# Patient Record
Sex: Female | Born: 1950 | Race: Asian | Hispanic: No | Smoking: Never smoker
Health system: Southern US, Community
[De-identification: ages and names within clinical notes are randomized; demographics above are authoritative.]

## PROBLEM LIST (undated history)

## (undated) DIAGNOSIS — E119 Type 2 diabetes mellitus without complications: Secondary | ICD-10-CM

## (undated) DIAGNOSIS — E785 Hyperlipidemia, unspecified: Secondary | ICD-10-CM

## (undated) DIAGNOSIS — E1100 Type 2 diabetes mellitus with hyperosmolarity without nonketotic hyperglycemic-hyperosmolar coma (NKHHC): Secondary | ICD-10-CM

---

## 1998-09-24 ENCOUNTER — Ambulatory Visit (HOSPITAL_COMMUNITY): Admission: RE | Admit: 1998-09-24 | Discharge: 1998-09-24 | Payer: Self-pay

## 1999-09-23 ENCOUNTER — Ambulatory Visit (HOSPITAL_COMMUNITY): Admission: RE | Admit: 1999-09-23 | Discharge: 1999-09-23 | Payer: Self-pay | Admitting: Family Medicine

## 1999-09-23 ENCOUNTER — Encounter: Payer: Self-pay | Admitting: Family Medicine

## 2000-12-28 ENCOUNTER — Encounter: Payer: Self-pay | Admitting: Emergency Medicine

## 2000-12-28 ENCOUNTER — Emergency Department (HOSPITAL_COMMUNITY): Admission: EM | Admit: 2000-12-28 | Discharge: 2000-12-28 | Payer: Self-pay | Admitting: Emergency Medicine

## 2015-07-23 ENCOUNTER — Other Ambulatory Visit: Payer: Self-pay

## 2015-07-23 DIAGNOSIS — Z1231 Encounter for screening mammogram for malignant neoplasm of breast: Secondary | ICD-10-CM

## 2015-08-05 ENCOUNTER — Ambulatory Visit
Admission: RE | Admit: 2015-08-05 | Discharge: 2015-08-05 | Disposition: A | Discharge status: Home / Self Care | Payer: BLUE CROSS/BLUE SHIELD | Source: Ambulatory Visit

## 2015-08-05 DIAGNOSIS — Z1231 Encounter for screening mammogram for malignant neoplasm of breast: Secondary | ICD-10-CM

## 2015-08-10 ENCOUNTER — Other Ambulatory Visit: Payer: Self-pay | Admitting: Family Medicine

## 2015-08-10 DIAGNOSIS — R928 Other abnormal and inconclusive findings on diagnostic imaging of breast: Secondary | ICD-10-CM

## 2015-11-08 ENCOUNTER — Ambulatory Visit
Admission: RE | Admit: 2015-11-08 | Discharge: 2015-11-08 | Disposition: A | Discharge status: Home / Self Care | Payer: BLUE CROSS/BLUE SHIELD | Source: Ambulatory Visit | Attending: Family Medicine | Admitting: Family Medicine

## 2015-11-08 ENCOUNTER — Other Ambulatory Visit: Payer: Self-pay | Admitting: Family Medicine

## 2015-11-08 DIAGNOSIS — R928 Other abnormal and inconclusive findings on diagnostic imaging of breast: Secondary | ICD-10-CM

## 2016-06-29 ENCOUNTER — Other Ambulatory Visit: Payer: Self-pay | Admitting: Family Medicine

## 2016-06-29 DIAGNOSIS — R921 Mammographic calcification found on diagnostic imaging of breast: Secondary | ICD-10-CM

## 2016-06-30 ENCOUNTER — Other Ambulatory Visit: Payer: Self-pay | Admitting: Family Medicine

## 2016-06-30 ENCOUNTER — Ambulatory Visit
Admission: RE | Admit: 2016-06-30 | Discharge: 2016-06-30 | Disposition: A | Discharge status: Home / Self Care | Payer: BLUE CROSS/BLUE SHIELD | Source: Ambulatory Visit | Attending: Family Medicine | Admitting: Family Medicine

## 2016-06-30 DIAGNOSIS — R059 Cough, unspecified: Secondary | ICD-10-CM

## 2016-06-30 DIAGNOSIS — R05 Cough: Secondary | ICD-10-CM

## 2016-07-28 ENCOUNTER — Ambulatory Visit
Admission: RE | Admit: 2016-07-28 | Discharge: 2016-07-28 | Disposition: A | Discharge status: Home / Self Care | Payer: BLUE CROSS/BLUE SHIELD | Source: Ambulatory Visit | Attending: Family Medicine | Admitting: Family Medicine

## 2016-07-28 ENCOUNTER — Encounter: Payer: Self-pay | Admitting: Radiology

## 2016-07-28 ENCOUNTER — Other Ambulatory Visit: Payer: Self-pay | Admitting: Family Medicine

## 2016-07-28 DIAGNOSIS — R921 Mammographic calcification found on diagnostic imaging of breast: Secondary | ICD-10-CM

## 2017-04-27 ENCOUNTER — Emergency Department (HOSPITAL_COMMUNITY): Payer: Worker's Compensation

## 2017-04-27 ENCOUNTER — Encounter (HOSPITAL_COMMUNITY): Payer: Self-pay | Admitting: *Deleted

## 2017-04-27 ENCOUNTER — Other Ambulatory Visit: Payer: Self-pay

## 2017-04-27 ENCOUNTER — Emergency Department (HOSPITAL_COMMUNITY)
Admission: EM | Admit: 2017-04-27 | Discharge: 2017-04-27 | Disposition: A | Discharge status: Home / Self Care | Payer: Worker's Compensation | Attending: Physician Assistant | Admitting: Physician Assistant

## 2017-04-27 DIAGNOSIS — Y9263 Factory as the place of occurrence of the external cause: Secondary | ICD-10-CM | POA: Insufficient documentation

## 2017-04-27 DIAGNOSIS — S6992XA Unspecified injury of left wrist, hand and finger(s), initial encounter: Secondary | ICD-10-CM | POA: Diagnosis present

## 2017-04-27 DIAGNOSIS — Y939 Activity, unspecified: Secondary | ICD-10-CM | POA: Insufficient documentation

## 2017-04-27 DIAGNOSIS — S68617A Complete traumatic transphalangeal amputation of left little finger, initial encounter: Secondary | ICD-10-CM | POA: Insufficient documentation

## 2017-04-27 DIAGNOSIS — Y990 Civilian activity done for income or pay: Secondary | ICD-10-CM | POA: Diagnosis not present

## 2017-04-27 DIAGNOSIS — S68119A Complete traumatic metacarpophalangeal amputation of unspecified finger, initial encounter: Secondary | ICD-10-CM

## 2017-04-27 DIAGNOSIS — Z23 Encounter for immunization: Secondary | ICD-10-CM | POA: Insufficient documentation

## 2017-04-27 DIAGNOSIS — W230XXA Caught, crushed, jammed, or pinched between moving objects, initial encounter: Secondary | ICD-10-CM | POA: Diagnosis not present

## 2017-04-27 LAB — CBC WITH DIFFERENTIAL/PLATELET
BASOS ABS: 0 10*3/uL (ref 0.0–0.1)
Basophils Relative: 0 %
EOS PCT: 6 %
Eosinophils Absolute: 0.4 10*3/uL (ref 0.0–0.7)
HCT: 41.6 % (ref 36.0–46.0)
Hemoglobin: 13.9 g/dL (ref 12.0–15.0)
LYMPHS ABS: 2.4 10*3/uL (ref 0.7–4.0)
Lymphocytes Relative: 35 %
MCH: 28.3 pg (ref 26.0–34.0)
MCHC: 33.4 g/dL (ref 30.0–36.0)
MCV: 84.7 fL (ref 78.0–100.0)
MONO ABS: 0.6 10*3/uL (ref 0.1–1.0)
Monocytes Relative: 9 %
Neutro Abs: 3.6 10*3/uL (ref 1.7–7.7)
Neutrophils Relative %: 50 %
PLATELETS: 205 10*3/uL (ref 150–400)
RBC: 4.91 MIL/uL (ref 3.87–5.11)
RDW: 12.8 % (ref 11.5–15.5)
WBC: 7 10*3/uL (ref 4.0–10.5)

## 2017-04-27 LAB — BASIC METABOLIC PANEL
Anion gap: 7 (ref 5–15)
BUN: 12 mg/dL (ref 6–20)
CO2: 26 mmol/L (ref 22–32)
CREATININE: 0.79 mg/dL (ref 0.44–1.00)
Calcium: 9.3 mg/dL (ref 8.9–10.3)
Chloride: 107 mmol/L (ref 101–111)
GFR calc Af Amer: >60 mL/min (ref >60)
Glucose, Bld: 124 mg/dL — ABNORMAL HIGH (ref 65–99)
Potassium: 4 mmol/L (ref 3.5–5.1)
SODIUM: 140 mmol/L (ref 135–145)

## 2017-04-27 LAB — PROTIME-INR
INR: 1.08
Prothrombin Time: 13.9 seconds (ref 11.4–15.2)

## 2017-04-27 MED ORDER — ONDANSETRON HCL 4 MG/2ML IJ SOLN
INTRAMUSCULAR | Status: AC
  Filled 2017-04-27: qty 2

## 2017-04-27 MED ORDER — BUPIVACAINE HCL (PF) 0.25 % IJ SOLN: 30.0000 mL | Freq: Once | INTRAMUSCULAR | Status: DC

## 2017-04-27 MED ORDER — CEPHALEXIN 500 MG PO CAPS: 500.0000 mg | ORAL_CAPSULE | Freq: Four times a day (QID) | ORAL | 0 refills | Status: AC

## 2017-04-27 MED ORDER — SODIUM CHLORIDE 0.9 % IV BOLUS (SEPSIS)
1000.0000 mL | Freq: Once | INTRAVENOUS | Status: AC
  Administered 2017-04-27: 1000 mL via INTRAVENOUS

## 2017-04-27 MED ORDER — CEFAZOLIN SODIUM-DEXTROSE 1-4 GM/50ML-% IV SOLN
1.0000 g | Freq: Once | INTRAVENOUS | Status: AC
  Administered 2017-04-27: 1 g via INTRAVENOUS
  Filled 2017-04-27: qty 50

## 2017-04-27 MED ORDER — BUPIVACAINE HCL 0.25 % IJ SOLN
5.0000 mL | Freq: Once | INTRAMUSCULAR | Status: DC
  Filled 2017-04-27: qty 5

## 2017-04-27 MED ORDER — HYDROCODONE-ACETAMINOPHEN 5-325 MG PO TABS: 1.0000 | ORAL_TABLET | ORAL | 0 refills | Status: DC | PRN

## 2017-04-27 MED ORDER — ONDANSETRON HCL 4 MG/2ML IJ SOLN
4.0000 mg | Freq: Once | INTRAMUSCULAR | Status: AC
  Administered 2017-04-27: 4 mg via INTRAVENOUS

## 2017-04-27 MED ORDER — TETANUS-DIPHTH-ACELL PERTUSSIS 5-2.5-18.5 LF-MCG/0.5 IM SUSP
0.5000 mL | Freq: Once | INTRAMUSCULAR | Status: AC
  Administered 2017-04-27: 0.5 mL via INTRAMUSCULAR
  Filled 2017-04-27: qty 0.5

## 2017-04-27 MED ORDER — BUPIVACAINE HCL (PF) 0.25 % IJ SOLN
5.0000 mL | Freq: Once | INTRAMUSCULAR | Status: AC
  Administered 2017-04-27: 5 mL
  Filled 2017-04-27: qty 30

## 2017-04-27 MED ORDER — MORPHINE SULFATE (PF) 4 MG/ML IV SOLN
4.0000 mg | Freq: Once | INTRAVENOUS | Status: AC
  Administered 2017-04-27: 4 mg via INTRAVENOUS
  Filled 2017-04-27: qty 1

## 2017-04-27 MED ORDER — METOCLOPRAMIDE HCL 5 MG/ML IJ SOLN
5.0000 mg | Freq: Once | INTRAMUSCULAR | Status: AC
  Administered 2017-04-27: 5 mg via INTRAVENOUS
  Filled 2017-04-27: qty 2

## 2017-04-27 NOTE — ED Provider Notes (Signed)
[facility] Provider Note   CSN: [id number] Arrival date & time: 04/27/17  1716     History   Chief Complaint Chief Complaint  Patient presents with  . Hand Injury    HPI Jorene L Marga HootsSiu is a 66 y.o. female.  HPI   Patient is a 66 year old female with no significant past medical history presenting for a left fifth digit distal finger amputation that occurred around 4:50 PM on 04-27-2017.  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  Her nail got caught in the glide rail apparatus as the cart got away from her and when she pulled back it removed the distal tip of the finger.  No clear crush injury occurred.  Patient has sensation to the distal tip of the remaining finger, however she has had difficulty with range of motion due to pain.  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  An unknown hemostatic powder was applied to the distal tip of the finger at the factory.  Tetanus is not up-to-date. Patient is not diabetic.  History obtained with [place] interpreter present.  History reviewed. No pertinent past medical history.  There are no active problems to display for this patient.   History reviewed. No pertinent surgical history.  OB History    No data available       Home Medications    Prior to Admission medications   Medication Sig Start Date End Date Taking? Authorizing Provider  cephALEXin (KEFLEX) 500 MG capsule Take 1 capsule (500 mg total) by mouth 4 (four) times daily for 7 days. 04/27/17 05/04/17  Aviva KluverMurray, Keante Urizar B, PA-C  HYDROcodone-acetaminophen (NORCO/VICODIN) 5-325 MG tablet Take 1 tablet by mouth every 4 (four) hours as needed. 04/27/17   Elisha PonderMurray, Denys Labree B, PA-C    Family History No family history on file.  Social History Social History   Tobacco Use  .  Smoking status: Never Smoker  . Smokeless tobacco: Never Used  Substance Use Topics  . Alcohol use: No    Frequency: Never  . Drug use: Not on file     Allergies   Patient has no known allergies.   Review of Systems Review of Systems  Respiratory: Negative for shortness of breath.   Cardiovascular: Negative for chest pain.  Gastrointestinal: Positive for nausea.  Skin: Positive for wound.  Neurological: Negative for syncope, weakness and numbness.  All other systems reviewed and are negative.    Physical Exam Updated Vital Signs BP (!) 159/68 (BP Location: Right Arm)   Pulse 63   Temp 98.8 F (37.1 C) (Oral)   Resp 20   Ht 5\' 2"  (1.575 m)   Wt 68 kg (150 lb)   SpO2 99%   BMI 27.44 kg/m   Physical Exam  Constitutional: She appears well-developed and well-nourished. No distress.  HENT:  Head: Normocephalic and atraumatic.  Eyes: Conjunctivae and EOM are normal.  Neck: Normal range of motion. Neck supple.  Cardiovascular: Normal rate, regular rhythm, S1 normal and S2 normal.  No murmur heard. Pulmonary/Chest: Effort normal and breath sounds normal. She has no wheezes. She has no rales.  Abdominal: She exhibits no distension.  Musculoskeletal: Normal range of motion. She exhibits no edema or deformity.  Hand Exam:  Inspection: There is an amputation of the distal tip of the left fifth  digit with avulsion of the nail bed.  No protruding bone. Palpation: No crepitus, tenderness over anatomic snuffbox, or scapholunate joint tenderness ROM: Passive/active ROM intact at wrist, MCP, PIP, and DIP joints, thumb MCP and IP joints, although limited in the left fifth digit due to pain and no rotational deformity of metacarpals noted. Flexor/Extensor tendons: FDS/FDP tendons intact in digits 2-5 at PIP/DIP joints. Respectively; extensor tendons intact in all digits.  Flexor and extensor mechanisms of the left fifth digit were examined after digital block. Nerve testing:    -Radial: Thumb/wrist extension intact and 5/5 strength with resistance. Sensation to light touch intact at dorsal CMC joint. -Median: Thumb opposition intact and 5/5 strength with resistance. Sensation to light touch intact on palmar side of 1st and 2nd digits. -Ulnar: Thumb adduction intact and 5/5 strength with resistance. Sensation to light touch intact on palmar side of 5th digit.  Sensation intact to the distal tip of remaining finger on the left fifth digit. Vascular: 2+ radial and ulnar pulses. Capillary refill <2 seconds b/l.  Neurological: She is alert.  Patient was extremities symmetrically and with good coordination.Cranial nerves grossly intact.   Skin: Skin is warm and dry. No erythema.  Psychiatric: She has a normal mood and affect. Her behavior is normal. Judgment and thought content normal.  Nursing note and vitals reviewed.            ED Treatments / Results  Labs (all labs ordered are listed, but only abnormal results are displayed) Labs Reviewed  BASIC METABOLIC PANEL - Abnormal; Notable for the following components:      Result Value   Glucose, Bld 124 (*)    All other components within normal limits  CBC WITH DIFFERENTIAL/PLATELET  PROTIME-INR    EKG  EKG Interpretation None       Radiology Dg Finger Little Left  Result Date: 04/27/2017 CLINICAL DATA:  Traumatic amputation of the fifth finger. EXAM: LEFT LITTLE FINGER 2+V COMPARISON:  None. FINDINGS: The distal tuft of the fifth finger has been amputated. There is a soft tissue defect corresponding to the level amputation. No foreign body is seen. IMPRESSION: Amputation of the distal tuft of the fifth finger. Soft tissue defect. No foreign body. Electronically Signed   By: Elsie Stain M.D.   On: 04/27/2017 19:45    Procedures Irrigation Date/Time: 04/28/2017 3:32 AM Performed by: Elisha Ponder, PA-C Authorized by: Elisha Ponder, PA-C  Consent: Verbal consent obtained. Consent given  by: patient Patient understanding: patient states understanding of the procedure being performed Patient consent: the patient's understanding of the procedure matches consent given Imaging studies: imaging studies available Patient identity confirmed: verbally with patient Local anesthesia used: yes Anesthesia: digital block  Anesthesia: Local anesthesia used: yes Local Anesthetic: bupivacaine 0.25% without epinephrine Patient tolerance: Patient tolerated the procedure well with no immediate complications    (including critical care time)  Medications Ordered in ED Medications  ondansetron (ZOFRAN) 4 MG/2ML injection (not administered)  Tdap (BOOSTRIX) injection 0.5 mL (0.5 mLs Intramuscular Given 04/27/17 1855)  sodium chloride 0.9 % bolus 1,000 mL (0 mLs Intravenous Stopped 04/27/17 2036)  morphine 4 MG/ML injection 4 mg (4 mg Intravenous Given 04/27/17 1848)  ceFAZolin (ANCEF) IVPB 1 g/50 mL premix (0 g Intravenous Stopped 04/27/17 1954)  morphine 4 MG/ML injection 4 mg (4 mg Intravenous Given 04/27/17 2033)  bupivacaine (PF) (MARCAINE) 0.25 % injection 5 mL (5 mLs Infiltration Given 04/27/17 2048)  ondansetron (ZOFRAN) injection 4 mg (4 mg Intravenous  Given 04/27/17 2115)  metoCLOPramide (REGLAN) injection 5 mg (5 mg Intravenous Given 04/27/17 2253)     Initial Impression / Assessment and Plan / ED Course  I have reviewed the triage vital signs and the nursing notes.  Pertinent labs & imaging results that were available during my care of the patient were reviewed by me and considered in my medical decision making (see chart for details).  Clinical Course as of Apr 28 340  Sat Apr 28, 2017  0341 BP: (!) 127/95 [AM]  0341 BP: (!) 127/95 [AM]    Clinical Course User Index [AM] Elisha PonderMurray, Samil Mecham B, PA-C    Final Clinical Impressions(s) / ED Diagnoses   Final diagnoses:  Traumatic amputation of fingertip, initial encounter   Patient is well-appearing and in no acute  distress.  Pain controlled in the emergency department today.  Discussed the management of this amputation with Dr. Mina MarbleWeingold of hand surgery.  Per Dr. Read DriversMangels her condition, this is not viable tissue for reattachment given patient's age.  Recommended Xeroform dressing over tip of finger and follow-up early next week for revision.  Irrigation with digital block revealed no protruding bone and evidence of an intact nail bed.  Tetanus shot updated today.  Antibiotic prophylaxis with Keflex.  Return precautions given for any redness or drainage from the finger.  Patient and her family are understanding and agree with the plan of care.  Blood pressure normotensive on leaving the ED. patient was significantly hypertensive on presentation to the emergency department.  This was likely secondary to pain. On my final reevaluation, blood pressure was 127/95.  Patient to follow-up with primary care provider regarding elevated blood pressure.  Readings provided in discharge paperwork.  This is a shared visit with Dr. Arby BarretteMarcy Pfeiffer. Patient was independently evaluated by this attending physician. Attending physician consulted in evaluation and discharge management.   ED Discharge Orders        Ordered    cephALEXin (KEFLEX) 500 MG capsule  4 times daily     04/27/17 2310    HYDROcodone-acetaminophen (NORCO/VICODIN) 5-325 MG tablet  Every 4 hours PRN     04/27/17 2310       Delia ChimesMurray, Nikolay Demetriou B, PA-C 04/28/17 0342    Arby BarrettePfeiffer, Marcy, MD 04/29/17 [phone]

## 2017-04-27 NOTE — ED Provider Notes (Signed)
Medical screening examination/treatment/procedure(s) were conducted as a shared visit with non-physician practitioner(s) and myself.  I personally evaluated the patient during the encounter.   EKG Interpretation None     Patient sustained avulsion injury in a work accident. Examined and has complete avulsion of the distal tip of the fifth left finger. Ancef and IV pain medication administered in the emergency department.  Consultation by  Clelia CroftPA-C Murray with Dr. Mina MarbleWeingold.  I agree with plan of management.   Arby BarrettePfeiffer, Natalia Wittmeyer, MD 04/27/17 2027

## 2017-04-27 NOTE — ED Triage Notes (Signed)
The pt was at work and was pushing a cart and the cart struck a metal  Piece on the wall and the distal tip of her lt little  Finger has been cut completely off they brought it with them

## 2017-04-27 NOTE — Discharge Instructions (Addendum)
Please see the information and instructions below regarding your visit.  Your diagnoses today include:  1. Traumatic amputation of fingertip, initial encounter      Tests performed today include: X-ray of the affected area that did not show any foreign bodies or broken bones Vital signs. See below for your results today.   Medications prescribed:   Keflex is an antibiotic. Please take all of your antibiotics until finished.   You may develop abdominal discomfort or nausea from the antibiotic. If this occurs, you may take it with food. Some patients also get diarrhea with antibiotics. You may help offset this with probiotics which you can buy or get in yogurt. Do not eat or take the probiotics until 2 hours after your antibiotic. Some women develop vaginal yeast infections after antibiotics. If you develop unusual vaginal discharge after being on this medication, please see your primary care provider.   Some people develop allergies to antibiotics. Symptoms of antibiotic allergy can be mild and include a flat rash and itching. They can also be more serious and include:  ?Hives - Hives are raised, red patches of skin that are usually very itchy.  ?Lip or tongue swelling  ?Trouble swallowing or breathing  ?Blistering of the skin or mouth.  If you have any of these serious symptoms, please seek emergency medical care immediately.  Take any prescribed medications only as directed.  You have been prescribed Norco for pain. This is an opioid pain medication. You may take this medication every 4-6 hours as needed for pain. Only take this medication if you need it for breakthrough pain. You may combine this medicine with ibuprofen, a non-steroidal anti-inflammatory drug (NSAID) every 6 hours, so you are getting something for pain relief every 3 hours.  Do not combine this medication with Tylenol, as it may increase the risk of liver problems.  Do not combine this medication with  alcohol.  Please be advised to avoid driving or operating heavy machinery while taking this medication, as it may make you drowsy or impair judgment.    Home care instructions:  Follow any educational materials and wound care instructions contained in this packet.   Keep affected area above the level of your heart when possible to minimize swelling. Wash area gently twice a day with warm soapy water. Do not apply alcohol or hydrogen peroxide directly over a wound. Cover the area if it is draining or weeping. Keep the bandage in place for until you see Dr. Mina MarbleWeingold.  Follow-up instructions: Please follow-up with Dr. Mina MarbleWeingold in hand surgery early next week.  Please call first thing Monday morning to get into his office.  Return instructions:  Return to the Emergency Department if you have: Fever Worsening pain Worsening swelling of the wound Pus draining from the wound Redness of the skin that moves away from the wound, especially if it streaks away from the affected area  Any other emergent concerns  Your vital signs today were: BP (!) 159/68 (BP Location: Right Arm)    Pulse 63    Temp 98.8 F (37.1 C) (Oral)    Resp 20    Ht 5\' 2"  (1.575 m)    Wt 68 kg (150 lb)    SpO2 99%    BMI 27.44 kg/m  If your blood pressure (BP) was elevated on multiple readings during this visit above 130 for the top number or above 80 for the bottom number, please have this repeated by your primary care provider within one  month. --------------  Thank you for allowing us to participate in your care today! It was a pleasure taking care of you.

## 2020-01-27 ENCOUNTER — Other Ambulatory Visit: Payer: Self-pay

## 2020-01-27 ENCOUNTER — Emergency Department (HOSPITAL_COMMUNITY): Admission: EM | Admit: 2020-01-27 | Discharge: 2020-01-27 | Discharge status: Absent without leave | Payer: Self-pay

## 2020-01-29 ENCOUNTER — Other Ambulatory Visit: Payer: Self-pay

## 2020-01-29 ENCOUNTER — Inpatient Hospital Stay (HOSPITAL_COMMUNITY)
Admission: EM | Admit: 2020-01-29 | Discharge: 2020-02-02 | DRG: 871 | Disposition: A | Discharge status: Home / Self Care | Payer: Medicare Other | Source: Ambulatory Visit | Attending: Internal Medicine | Admitting: Internal Medicine

## 2020-01-29 ENCOUNTER — Emergency Department (HOSPITAL_COMMUNITY): Payer: Self-pay

## 2020-01-29 ENCOUNTER — Encounter (HOSPITAL_COMMUNITY): Payer: Self-pay | Admitting: Pediatrics

## 2020-01-29 ENCOUNTER — Emergency Department (HOSPITAL_COMMUNITY)
Admission: EM | Admit: 2020-01-29 | Discharge: 2020-01-29 | Disposition: A | Discharge status: Home / Self Care | Payer: Self-pay | Attending: Emergency Medicine | Admitting: Emergency Medicine

## 2020-01-29 ENCOUNTER — Emergency Department (HOSPITAL_COMMUNITY): Payer: Medicare Other

## 2020-01-29 DIAGNOSIS — R531 Weakness: Secondary | ICD-10-CM | POA: Insufficient documentation

## 2020-01-29 DIAGNOSIS — R6510 Systemic inflammatory response syndrome (SIRS) of non-infectious origin without acute organ dysfunction: Secondary | ICD-10-CM | POA: Diagnosis present

## 2020-01-29 DIAGNOSIS — Z5321 Procedure and treatment not carried out due to patient leaving prior to being seen by health care provider: Secondary | ICD-10-CM | POA: Insufficient documentation

## 2020-01-29 DIAGNOSIS — W228XXA Striking against or struck by other objects, initial encounter: Secondary | ICD-10-CM | POA: Insufficient documentation

## 2020-01-29 DIAGNOSIS — Y999 Unspecified external cause status: Secondary | ICD-10-CM | POA: Insufficient documentation

## 2020-01-29 DIAGNOSIS — Y929 Unspecified place or not applicable: Secondary | ICD-10-CM | POA: Insufficient documentation

## 2020-01-29 DIAGNOSIS — A419 Sepsis, unspecified organism: Secondary | ICD-10-CM

## 2020-01-29 DIAGNOSIS — R0682 Tachypnea, not elsewhere classified: Secondary | ICD-10-CM | POA: Diagnosis present

## 2020-01-29 DIAGNOSIS — D696 Thrombocytopenia, unspecified: Secondary | ICD-10-CM | POA: Diagnosis present

## 2020-01-29 DIAGNOSIS — I4891 Unspecified atrial fibrillation: Secondary | ICD-10-CM | POA: Diagnosis present

## 2020-01-29 DIAGNOSIS — R634 Abnormal weight loss: Secondary | ICD-10-CM | POA: Diagnosis present

## 2020-01-29 DIAGNOSIS — Z20822 Contact with and (suspected) exposure to covid-19: Secondary | ICD-10-CM | POA: Diagnosis present

## 2020-01-29 DIAGNOSIS — A4151 Sepsis due to Escherichia coli [E. coli]: Secondary | ICD-10-CM | POA: Diagnosis not present

## 2020-01-29 DIAGNOSIS — J029 Acute pharyngitis, unspecified: Secondary | ICD-10-CM | POA: Insufficient documentation

## 2020-01-29 DIAGNOSIS — E1110 Type 2 diabetes mellitus with ketoacidosis without coma: Secondary | ICD-10-CM | POA: Diagnosis not present

## 2020-01-29 DIAGNOSIS — N390 Urinary tract infection, site not specified: Secondary | ICD-10-CM | POA: Diagnosis present

## 2020-01-29 DIAGNOSIS — E876 Hypokalemia: Secondary | ICD-10-CM | POA: Diagnosis present

## 2020-01-29 DIAGNOSIS — E785 Hyperlipidemia, unspecified: Secondary | ICD-10-CM | POA: Diagnosis present

## 2020-01-29 DIAGNOSIS — R630 Anorexia: Secondary | ICD-10-CM | POA: Insufficient documentation

## 2020-01-29 DIAGNOSIS — Y939 Activity, unspecified: Secondary | ICD-10-CM | POA: Insufficient documentation

## 2020-01-29 DIAGNOSIS — N179 Acute kidney failure, unspecified: Secondary | ICD-10-CM | POA: Diagnosis present

## 2020-01-29 HISTORY — DX: Type 2 diabetes mellitus without complications: E11.9

## 2020-01-29 HISTORY — DX: Hyperlipidemia, unspecified: E78.5

## 2020-01-29 LAB — CBG MONITORING, ED
Glucose-Capillary: 515 mg/dL (ref 70–99)
Glucose-Capillary: >600 mg/dL (ref 70–99)

## 2020-01-29 LAB — BASIC METABOLIC PANEL
Anion gap: 27 — ABNORMAL HIGH (ref 5–15)
BUN: 51 mg/dL — ABNORMAL HIGH (ref 8–23)
CO2: 17 mmol/L — ABNORMAL LOW (ref 22–32)
Calcium: 8.5 mg/dL — ABNORMAL LOW (ref 8.9–10.3)
Chloride: 83 mmol/L — ABNORMAL LOW (ref 98–111)
Creatinine, Ser: 1.86 mg/dL — ABNORMAL HIGH (ref 0.44–1.00)
GFR calc Af Amer: 31 mL/min — ABNORMAL LOW (ref >60)
GFR calc non Af Amer: 27 mL/min — ABNORMAL LOW (ref >60)
Glucose, Bld: 567 mg/dL (ref 70–99)
Potassium: 3.6 mmol/L (ref 3.5–5.1)
Sodium: 127 mmol/L — ABNORMAL LOW (ref 135–145)

## 2020-01-29 LAB — SARS CORONAVIRUS 2 BY RT PCR (HOSPITAL ORDER, PERFORMED IN ~~LOC~~ HOSPITAL LAB): SARS Coronavirus 2: NEGATIVE

## 2020-01-29 NOTE — ED Notes (Signed)
Triage rn jessica notified of cbg

## 2020-01-29 NOTE — ED Notes (Signed)
Called patient again for vitals patient didn't answer

## 2020-01-29 NOTE — ED Triage Notes (Signed)
Reported was sent back by PCP for further eval d/t high blood sugar. Patient c/o generalized aches and pains.

## 2020-01-29 NOTE — ED Notes (Signed)
Called patient for re check vitals patient didn't answer

## 2020-01-29 NOTE — ED Triage Notes (Signed)
Used translator line but pt is also very hard of hearing so had difficulty triaging. Pt reported walking the dog on Sunday and was pulled down, fell backwards and hit her back and head. Pt went to dr on Monday and had xrays and tests done but does not know any results. Reports pain all over but now also has sore throat, weakness and lack of appetite.

## 2020-01-29 NOTE — ED Notes (Signed)
Charge rn woody notified

## 2020-01-30 ENCOUNTER — Encounter (HOSPITAL_COMMUNITY): Payer: Self-pay | Admitting: Internal Medicine

## 2020-01-30 ENCOUNTER — Other Ambulatory Visit (HOSPITAL_COMMUNITY): Payer: Self-pay

## 2020-01-30 DIAGNOSIS — R6510 Systemic inflammatory response syndrome (SIRS) of non-infectious origin without acute organ dysfunction: Secondary | ICD-10-CM

## 2020-01-30 DIAGNOSIS — N179 Acute kidney failure, unspecified: Secondary | ICD-10-CM | POA: Diagnosis present

## 2020-01-30 DIAGNOSIS — R531 Weakness: Secondary | ICD-10-CM

## 2020-01-30 DIAGNOSIS — E1110 Type 2 diabetes mellitus with ketoacidosis without coma: Secondary | ICD-10-CM | POA: Diagnosis present

## 2020-01-30 DIAGNOSIS — Z20822 Contact with and (suspected) exposure to covid-19: Secondary | ICD-10-CM | POA: Diagnosis present

## 2020-01-30 DIAGNOSIS — E785 Hyperlipidemia, unspecified: Secondary | ICD-10-CM | POA: Diagnosis present

## 2020-01-30 DIAGNOSIS — R634 Abnormal weight loss: Secondary | ICD-10-CM | POA: Diagnosis present

## 2020-01-30 DIAGNOSIS — E876 Hypokalemia: Secondary | ICD-10-CM | POA: Diagnosis present

## 2020-01-30 DIAGNOSIS — D696 Thrombocytopenia, unspecified: Secondary | ICD-10-CM | POA: Diagnosis present

## 2020-01-30 DIAGNOSIS — N390 Urinary tract infection, site not specified: Secondary | ICD-10-CM | POA: Diagnosis present

## 2020-01-30 DIAGNOSIS — R0682 Tachypnea, not elsewhere classified: Secondary | ICD-10-CM | POA: Diagnosis present

## 2020-01-30 DIAGNOSIS — A4151 Sepsis due to Escherichia coli [E. coli]: Secondary | ICD-10-CM | POA: Diagnosis present

## 2020-01-30 DIAGNOSIS — I4891 Unspecified atrial fibrillation: Secondary | ICD-10-CM | POA: Diagnosis present

## 2020-01-30 LAB — URINALYSIS, ROUTINE W REFLEX MICROSCOPIC
Bacteria, UA: NONE SEEN
Bilirubin Urine: NEGATIVE
Glucose, UA: >=500 mg/dL — AB
Ketones, ur: 80 mg/dL — AB
Nitrite: NEGATIVE
Protein, ur: NEGATIVE mg/dL
Specific Gravity, Urine: 1.016 (ref 1.005–1.030)
pH: 5 (ref 5.0–8.0)

## 2020-01-30 LAB — BASIC METABOLIC PANEL
Anion gap: 28 — ABNORMAL HIGH (ref 5–15)
Anion gap: 12 (ref 5–15)
Anion gap: 13 (ref 5–15)
BUN: 57 mg/dL — ABNORMAL HIGH (ref 8–23)
BUN: 38 mg/dL — ABNORMAL HIGH (ref 8–23)
BUN: 35 mg/dL — ABNORMAL HIGH (ref 8–23)
CO2: 14 mmol/L — ABNORMAL LOW (ref 22–32)
CO2: 26 mmol/L (ref 22–32)
CO2: 22 mmol/L (ref 22–32)
Calcium: 7.9 mg/dL — ABNORMAL LOW (ref 8.9–10.3)
Calcium: 7.8 mg/dL — ABNORMAL LOW (ref 8.9–10.3)
Calcium: 8 mg/dL — ABNORMAL LOW (ref 8.9–10.3)
Chloride: 90 mmol/L — ABNORMAL LOW (ref 98–111)
Chloride: 104 mmol/L (ref 98–111)
Chloride: 108 mmol/L (ref 98–111)
Creatinine, Ser: 1.98 mg/dL — ABNORMAL HIGH (ref 0.44–1.00)
Creatinine, Ser: 1.16 mg/dL — ABNORMAL HIGH (ref 0.44–1.00)
Creatinine, Ser: 0.94 mg/dL (ref 0.44–1.00)
GFR calc Af Amer: 29 mL/min — ABNORMAL LOW (ref >60)
GFR calc Af Amer: 55 mL/min — ABNORMAL LOW (ref >60)
GFR calc Af Amer: >60 mL/min (ref >60)
GFR calc non Af Amer: 25 mL/min — ABNORMAL LOW (ref >60)
GFR calc non Af Amer: 48 mL/min — ABNORMAL LOW (ref >60)
GFR calc non Af Amer: >60 mL/min (ref >60)
Glucose, Bld: 547 mg/dL (ref 70–99)
Glucose, Bld: 206 mg/dL — ABNORMAL HIGH (ref 70–99)
Glucose, Bld: 204 mg/dL — ABNORMAL HIGH (ref 70–99)
Potassium: 5.3 mmol/L — ABNORMAL HIGH (ref 3.5–5.1)
Potassium: 3 mmol/L — ABNORMAL LOW (ref 3.5–5.1)
Potassium: 3.2 mmol/L — ABNORMAL LOW (ref 3.5–5.1)
Sodium: 132 mmol/L — ABNORMAL LOW (ref 135–145)
Sodium: 142 mmol/L (ref 135–145)
Sodium: 143 mmol/L (ref 135–145)

## 2020-01-30 LAB — BLOOD CULTURE ID PANEL (REFLEXED) - BCID2

## 2020-01-30 LAB — CBG MONITORING, ED
Glucose-Capillary: 515 mg/dL (ref 70–99)
Glucose-Capillary: 558 mg/dL (ref 70–99)
Glucose-Capillary: 473 mg/dL — ABNORMAL HIGH (ref 70–99)
Glucose-Capillary: 472 mg/dL — ABNORMAL HIGH (ref 70–99)
Glucose-Capillary: 353 mg/dL — ABNORMAL HIGH (ref 70–99)
Glucose-Capillary: 431 mg/dL — ABNORMAL HIGH (ref 70–99)
Glucose-Capillary: 262 mg/dL — ABNORMAL HIGH (ref 70–99)
Glucose-Capillary: 208 mg/dL — ABNORMAL HIGH (ref 70–99)
Glucose-Capillary: 185 mg/dL — ABNORMAL HIGH (ref 70–99)
Glucose-Capillary: 178 mg/dL — ABNORMAL HIGH (ref 70–99)
Glucose-Capillary: 172 mg/dL — ABNORMAL HIGH (ref 70–99)
Glucose-Capillary: 220 mg/dL — ABNORMAL HIGH (ref 70–99)
Glucose-Capillary: 169 mg/dL — ABNORMAL HIGH (ref 70–99)
Glucose-Capillary: 188 mg/dL — ABNORMAL HIGH (ref 70–99)
Glucose-Capillary: 179 mg/dL — ABNORMAL HIGH (ref 70–99)
Glucose-Capillary: 234 mg/dL — ABNORMAL HIGH (ref 70–99)
Glucose-Capillary: 170 mg/dL — ABNORMAL HIGH (ref 70–99)

## 2020-01-30 LAB — CBC WITH DIFFERENTIAL/PLATELET
Abs Immature Granulocytes: 0.18 10*3/uL — ABNORMAL HIGH (ref 0.00–0.07)
Basophils Absolute: 0 10*3/uL (ref 0.0–0.1)
Basophils Relative: 0 %
Eosinophils Absolute: 0 10*3/uL (ref 0.0–0.5)
Eosinophils Relative: 0 %
HCT: 40.3 % (ref 36.0–46.0)
Hemoglobin: 13.1 g/dL (ref 12.0–15.0)
Immature Granulocytes: 1 %
Lymphocytes Relative: 11 %
Lymphs Abs: 1.7 10*3/uL (ref 0.7–4.0)
MCH: 27.5 pg (ref 26.0–34.0)
MCHC: 32.5 g/dL (ref 30.0–36.0)
MCV: 84.5 fL (ref 80.0–100.0)
Monocytes Absolute: 1.6 10*3/uL — ABNORMAL HIGH (ref 0.1–1.0)
Monocytes Relative: 10 %
Neutro Abs: 12.7 10*3/uL — ABNORMAL HIGH (ref 1.7–7.7)
Neutrophils Relative %: 78 %
Platelets: 25 10*3/uL — CL (ref 150–400)
RBC: 4.77 MIL/uL (ref 3.87–5.11)
RDW: 12.7 % (ref 11.5–15.5)
WBC: 16.2 10*3/uL — ABNORMAL HIGH (ref 4.0–10.5)
nRBC: 0 % (ref 0.0–0.2)

## 2020-01-30 LAB — MAGNESIUM: Magnesium: 1.9 mg/dL (ref 1.7–2.4)

## 2020-01-30 LAB — LACTATE DEHYDROGENASE: LDH: 146 U/L (ref 98–192)

## 2020-01-30 LAB — I-STAT VENOUS BLOOD GAS, ED
Acid-base deficit: 9 mmol/L — ABNORMAL HIGH (ref 0.0–2.0)
Bicarbonate: 16.7 mmol/L — ABNORMAL LOW (ref 20.0–28.0)
Calcium, Ion: 0.88 mmol/L — CL (ref 1.15–1.40)
HCT: 49 % — ABNORMAL HIGH (ref 36.0–46.0)
Hemoglobin: 16.7 g/dL — ABNORMAL HIGH (ref 12.0–15.0)
O2 Saturation: 99 %
Patient temperature: 37
Potassium: 4.6 mmol/L (ref 3.5–5.1)
Sodium: 130 mmol/L — ABNORMAL LOW (ref 135–145)
TCO2: 18 mmol/L — ABNORMAL LOW (ref 22–32)
pCO2, Ven: 36.1 mmHg — ABNORMAL LOW (ref 44.0–60.0)
pH, Ven: 7.274 (ref 7.250–7.430)
pO2, Ven: 146 mmHg — ABNORMAL HIGH (ref 32.0–45.0)

## 2020-01-30 LAB — DIC (DISSEMINATED INTRAVASCULAR COAGULATION)PANEL
D-Dimer, Quant: 1.17 ug/mL-FEU — ABNORMAL HIGH (ref 0.00–0.50)
Fibrinogen: 636 mg/dL — ABNORMAL HIGH (ref 210–475)
INR: 1.1 (ref 0.8–1.2)
Platelets: 25 10*3/uL — CL (ref 150–400)
Prothrombin Time: 13.6 seconds (ref 11.4–15.2)
Smear Review: NONE SEEN
aPTT: 24 seconds (ref 24–36)

## 2020-01-30 LAB — LACTIC ACID, PLASMA
Lactic Acid, Venous: 2.6 mmol/L (ref 0.5–1.9)
Lactic Acid, Venous: 1.8 mmol/L (ref 0.5–1.9)

## 2020-01-30 LAB — BETA-HYDROXYBUTYRIC ACID
Beta-Hydroxybutyric Acid: >8 mmol/L — ABNORMAL HIGH (ref 0.05–0.27)
Beta-Hydroxybutyric Acid: 0.81 mmol/L — ABNORMAL HIGH (ref 0.05–0.27)
Beta-Hydroxybutyric Acid: 0.23 mmol/L (ref 0.05–0.27)

## 2020-01-30 LAB — SAVE SMEAR(SSMR), FOR PROVIDER SLIDE REVIEW

## 2020-01-30 LAB — SARS CORONAVIRUS 2 BY RT PCR (HOSPITAL ORDER, PERFORMED IN ~~LOC~~ HOSPITAL LAB): SARS Coronavirus 2: NEGATIVE

## 2020-01-30 LAB — HIV ANTIBODY (ROUTINE TESTING W REFLEX): HIV Screen 4th Generation wRfx: NONREACTIVE

## 2020-01-30 LAB — TSH: TSH: 0.012 u[IU]/mL — ABNORMAL LOW (ref 0.350–4.500)

## 2020-01-30 MED ORDER — ONDANSETRON HCL 4 MG/2ML IJ SOLN
4.0000 mg | Freq: Once | INTRAMUSCULAR | Status: AC
  Administered 2020-01-30: 4 mg via INTRAVENOUS
  Filled 2020-01-30: qty 2

## 2020-01-30 MED ORDER — SODIUM CHLORIDE 0.9 % IV SOLN
2.0000 g | Freq: Every day | INTRAVENOUS | Status: DC
  Administered 2020-01-31 (×2): 2 g via INTRAVENOUS
  Filled 2020-01-30: qty 2
  Filled 2020-01-30 (×2): qty 20

## 2020-01-30 MED ORDER — INSULIN GLARGINE 100 UNIT/ML ~~LOC~~ SOLN
10.0000 [IU] | Freq: Every day | SUBCUTANEOUS | Status: DC
  Administered 2020-01-30: 10 [IU] via SUBCUTANEOUS
  Filled 2020-01-30 (×2): qty 0.1

## 2020-01-30 MED ORDER — INSULIN REGULAR(HUMAN) IN NACL 100-0.9 UT/100ML-% IV SOLN
INTRAVENOUS | Status: DC
  Administered 2020-01-30: 8.5 [IU]/h via INTRAVENOUS
  Filled 2020-01-30 (×2): qty 100

## 2020-01-30 MED ORDER — CALCIUM GLUCONATE-NACL 1-0.675 GM/50ML-% IV SOLN
1.0000 g | Freq: Once | INTRAVENOUS | Status: AC
  Administered 2020-01-30: 1000 mg via INTRAVENOUS
  Filled 2020-01-30: qty 50

## 2020-01-30 MED ORDER — SODIUM CHLORIDE 0.9 % IV SOLN: Freq: Once | INTRAVENOUS | Status: AC

## 2020-01-30 MED ORDER — SODIUM CHLORIDE 0.9 % IV SOLN: 1.0000 g | INTRAVENOUS | Status: DC

## 2020-01-30 MED ORDER — DEXTROSE 50 % IV SOLN: 0.0000 mL | INTRAVENOUS | Status: DC | PRN

## 2020-01-30 MED ORDER — INSULIN ASPART 100 UNIT/ML ~~LOC~~ SOLN
0.0000 [IU] | Freq: Three times a day (TID) | SUBCUTANEOUS | Status: DC
  Administered 2020-01-31 (×2): 5 [IU] via SUBCUTANEOUS
  Administered 2020-01-31: 3 [IU] via SUBCUTANEOUS
  Administered 2020-02-01: 2 [IU] via SUBCUTANEOUS

## 2020-01-30 MED ORDER — SODIUM CHLORIDE 0.9 % IV BOLUS
20.0000 mL/kg | Freq: Once | INTRAVENOUS | Status: AC
  Administered 2020-01-30: 1000 mL via INTRAVENOUS

## 2020-01-30 MED ORDER — INSULIN ASPART 100 UNIT/ML ~~LOC~~ SOLN
0.0000 [IU] | Freq: Every day | SUBCUTANEOUS | Status: DC
  Administered 2020-01-31 – 2020-02-01 (×2): 3 [IU] via SUBCUTANEOUS

## 2020-01-30 MED ORDER — POTASSIUM CHLORIDE 10 MEQ/100ML IV SOLN
10.0000 meq | INTRAVENOUS | Status: AC
  Administered 2020-01-30 (×2): 10 meq via INTRAVENOUS
  Filled 2020-01-30 (×2): qty 100

## 2020-01-30 MED ORDER — SODIUM CHLORIDE 0.9 % IV SOLN
1.0000 g | Freq: Once | INTRAVENOUS | Status: AC
  Administered 2020-01-30: 1 g via INTRAVENOUS
  Filled 2020-01-30: qty 10

## 2020-01-30 MED ORDER — SODIUM CHLORIDE 0.9 % IV SOLN: INTRAVENOUS | Status: DC

## 2020-01-30 MED ORDER — DEXTROSE IN LACTATED RINGERS 5 % IV SOLN: INTRAVENOUS | Status: DC

## 2020-01-30 MED ORDER — POTASSIUM CHLORIDE CRYS ER 20 MEQ PO TBCR
40.0000 meq | EXTENDED_RELEASE_TABLET | ORAL | Status: AC
  Administered 2020-01-30: 40 meq via ORAL
  Filled 2020-01-30: qty 2

## 2020-01-30 NOTE — ED Notes (Signed)
Had secretary re-page floor coverage due to not receiving a call back.

## 2020-01-30 NOTE — ED Provider Notes (Signed)
[facility] Provider Note  CSN: [id number] Arrival date & time: 01/29/20 1712  Chief Complaint(s) Hyperglycemia  HPI Jillian Travis is a 69 y.o. female with past medical history of hyperlipidemia and prediabetes who presents to the emergency departments as a referral from clinic.  She went there for evaluation of headache following a mechanical fall at home this past Sunday.  During that time she tripped over her dog causing her to pull a set of drawers onto her head.  At that time she also was complaining of fever.  She had a work-up by the PCP notable for leukocytosis, severe hyperglycemia and urinary tract infection.  Patient is a poor historian even with a Nurse, learning disabilitytranslator.  She continues to talk about her fall.  She did state that she does not feel well but cannot provide any specific details regarding this.  When I asked, if she is in pain she continues to talk about her fall.   Remainder of history, ROS, and physical exam limited due to patient's condition (poor historian vs AMS). Additional information was obtained from notes from PCP.   Level V Caveat.     Hyperglycemia   Past Medical History History reviewed. No pertinent past medical history. There are no problems to display for this patient.  Home Medication(s) Prior to Admission medications   Medication Sig Start Date End Date Taking? Authorizing Provider  HYDROcodone-acetaminophen (NORCO/VICODIN) 5-325 MG tablet Take 1 tablet by mouth every 4 (four) hours as needed. 04/27/17   Elisha PonderMurray, Alyssa B, PA-C                                                                                                                                    Past Surgical History History reviewed. No pertinent surgical history. Family History No family history on file.  Social History Social History   Tobacco Use  . Smoking status: Never Smoker  . Smokeless tobacco: Never Used  Substance Use Topics  . Alcohol  use: No  . Drug use: Not on file   Allergies Patient has no known allergies.  Review of Systems Review of Systems  Unable to perform ROS: Mental status change    Physical Exam Vital Signs  I have reviewed the triage vital signs BP (!) 101/48   Pulse 88   Temp 97.6 F (36.4 C) (Oral)   Resp 14   SpO2 98%   Physical Exam Vitals reviewed.  Constitutional:      General: She is not in acute distress.    Appearance: She is well-developed. She is not diaphoretic.  HENT:     Head: Normocephalic and atraumatic.     Nose: Nose normal.  Eyes:     General: No scleral icterus.       Right eye: No discharge.        Left eye: No discharge.     Conjunctiva/sclera: Conjunctivae normal.  Pupils: Pupils are equal, round, and reactive to light.  Cardiovascular:     Rate and Rhythm: Normal rate and regular rhythm.     Heart sounds: No murmur heard.  No friction rub. No gallop.   Pulmonary:     Effort: Pulmonary effort is normal. No respiratory distress.     Breath sounds: Normal breath sounds. No stridor. No rales.  Abdominal:     General: There is no distension.     Palpations: Abdomen is soft.     Tenderness: There is abdominal tenderness in the suprapubic area.  Musculoskeletal:        General: No tenderness.     Cervical back: Normal range of motion and neck supple.  Skin:    General: Skin is warm and dry.     Findings: No erythema or rash.  Neurological:     Mental Status: She is alert.     ED Results and Treatments Labs (all labs ordered are listed, but only abnormal results are displayed) Labs Reviewed  BASIC METABOLIC PANEL - Abnormal; Notable for the following components:      Result Value   Sodium 127 (*)    Chloride 83 (*)    CO2 17 (*)    Glucose, Bld 567 (*)    BUN 51 (*)    Creatinine, Ser 1.86 (*)    Calcium 8.5 (*)    GFR calc non Af Amer 27 (*)    GFR calc Af Amer 31 (*)    Anion gap 27 (*)    All other components within normal limits  CBC -  Abnormal; Notable for the following components:   WBC 17.1 (*)    RBC 5.35 (*)    HCT 46.1 (*)    Platelets 29 (*)    All other components within normal limits  CBG MONITORING, ED - Abnormal; Notable for the following components:   Glucose-Capillary 515 (*)    All other components within normal limits  CBG MONITORING, ED - Abnormal; Notable for the following components:   Glucose-Capillary >600 (*)    All other components within normal limits  CBG MONITORING, ED - Abnormal; Notable for the following components:   Glucose-Capillary 558 (*)    All other components within normal limits  SARS CORONAVIRUS 2 BY RT PCR (HOSPITAL ORDER, PERFORMED IN [facility])  CULTURE, BLOOD (SINGLE)  CULTURE, BLOOD (ROUTINE X 2)  CULTURE, BLOOD (ROUTINE X 2)  URINALYSIS, ROUTINE W REFLEX MICROSCOPIC  BETA-HYDROXYBUTYRIC ACID  BETA-HYDROXYBUTYRIC ACID  BETA-HYDROXYBUTYRIC ACID  LACTIC ACID, PLASMA  LACTIC ACID, PLASMA  CBG MONITORING, ED  I-STAT VENOUS BLOOD GAS, ED                                                                                                                         EKG  EKG Interpretation  Date/Time:  Friday January 30 2020 02:27:16 EDT Ventricular Rate:  200 PR Interval:    QRS Duration: 70 QT Interval:  263 QTC Calculation: 480 R Axis:   59 Text Interpretation: Atrial fibrillation with rapid V-rate Repolarization abnormality, prob rate related Baseline wander in lead(s) V6 No old tracing to compare Confirmed by Drema Pry [phone]) on 01/30/2020 3:07:31 AM      Radiology DG Chest 2 View  Result Date: 01/29/2020 CLINICAL DATA:  69 year old female with history of weakness. EXAM: CHEST - 2 VIEW COMPARISON:  Chest x-ray 06/30/2016. FINDINGS: Lung volumes are normal. No consolidative airspace disease. No pleural effusions. No pneumothorax. No pulmonary nodule or mass noted. Pulmonary vasculature and the cardiomediastinal silhouette are within normal limits.  Atherosclerosis in the thoracic aorta. IMPRESSION: 1.  No radiographic evidence of acute cardiopulmonary disease. 2. Aortic atherosclerosis. Electronically Signed   By: Trudie Reed M.D.   On: 01/29/2020 12:03   CT Head Wo Contrast  Result Date: 01/29/2020 CLINICAL DATA:  Head trauma. EXAM: CT HEAD WITHOUT CONTRAST TECHNIQUE: Contiguous axial images were obtained from the base of the skull through the vertex without intravenous contrast. COMPARISON:  None. FINDINGS: Brain: No evidence of acute infarction, hemorrhage, hydrocephalus, extra-axial collection or mass lesion/mass effect. Vascular: No hyperdense vessel or unexpected calcification. Skull: Normal. Negative for fracture or focal lesion. Sinuses/Orbits: Globes and orbits are unremarkable. The visualized sinuses and mastoid air cells are clear. Other: None. IMPRESSION: Negative unenhanced CT scan of the brain. Electronically Signed   By: Amie Portland M.D.   On: 01/29/2020 19:03    Pertinent labs & imaging results that were available during my care of the patient were reviewed by me and considered in my medical decision making (see chart for details).  Medications Ordered in ED Medications  insulin regular, human (MYXREDLIN) 100 units/ 100 mL infusion (8.5 Units/hr Intravenous New Bag/Given 01/30/20 0251)  dextrose 5 % in lactated ringers infusion (has no administration in time range)  dextrose 50 % solution 0-50 mL (has no administration in time range)  potassium chloride 10 mEq in 100 mL IVPB (has no administration in time range)  0.9 %  sodium chloride infusion (has no administration in time range)  cefTRIAXone (ROCEPHIN) 1 g in sodium chloride 0.9 % 100 mL IVPB (1 g Intravenous New Bag/Given 01/30/20 0328)  ondansetron (ZOFRAN) injection 4 mg (4 mg Intravenous Given 01/30/20 0009)  0.9 %  sodium chloride infusion ( Intravenous Stopped 01/30/20 0110)  sodium chloride 0.9 % bolus 20 mL/kg (1,000 mLs Intravenous New Bag/Given 01/30/20 0243)                                                                                                                                     Procedures .1-3 Lead EKG Interpretation Performed by: Nira Conn, MD Authorized by: Nira Conn, MD     Interpretation: normal     ECG rate:  89   ECG rate assessment: normal     Rhythm: sinus rhythm     Ectopy: none  Conduction: normal   .1-3 Lead EKG Interpretation Performed by: Nira Conn, MD Authorized by: Nira Conn, MD     Interpretation: abnormal     ECG rate:  178   ECG rate assessment: tachycardic     Rhythm: atrial fibrillation     Ectopy: none     Conduction: normal   .Critical Care Performed by: Nira Conn, MD Authorized by: Nira Conn, MD    CRITICAL CARE Performed by: Amadeo Garnet Keller Bounds Total critical care time: 55 minutes Critical care time was exclusive of separately billable procedures and treating other patients. Critical care was necessary to treat or prevent imminent or life-threatening deterioration. Critical care was time spent personally by me on the following activities: development of treatment plan with patient and/or surrogate as well as nursing, discussions with consultants, evaluation of patient's response to treatment, examination of patient, obtaining history from patient or surrogate, ordering and performing treatments and interventions, ordering and review of laboratory studies, ordering and review of radiographic studies, pulse oximetry and re-evaluation of patient's condition.    (including critical care time)  Medical Decision Making / ED Course I have reviewed the nursing notes for this encounter and the patient's prior records (if available in EHR or on provided paperwork).   Tamea L Nikolai was evaluated in Emergency Department on 01/30/2020 for the symptoms described in the history of present illness. She was evaluated in the context of the  global COVID-19 pandemic, which necessitated consideration that the patient might be at risk for infection with the SARS-CoV-2 virus that causes COVID-19. Institutional protocols and algorithms that pertain to the evaluation of patients at risk for COVID-19 are in a state of rapid change based on information released by regulatory bodies including the CDC and federal and state organizations. These policies and algorithms were followed during the patient's care in the ED.  Work-up is consistent with diabetic ketoacidosis.  Based on previous labs, code sepsis was initiated and patient was started on empiric antibiotics for likely urinary source.  Patient was noted to spontaneously going to A. fib RVR.  After IV fluids were initiated, patient converted back to normal sinus rhythm.  Given the patient's possible altered mental status and recent fall with thrombocytopenia noted on CBC, CT head obtained which was unremarkable for any ICH.  We'll admit patient to medicine for further work-up and management.      Final Clinical Impression(s) / ED Diagnoses Final diagnoses:  Sepsis with acute renal failure without septic shock, due to unspecified organism, unspecified acute renal failure type (HCC)  Diabetic ketoacidosis without coma associated with type 2 diabetes mellitus (HCC)      This chart was dictated using voice recognition software.  Despite best efforts to proofread,  errors can occur which can change the documentation meaning.   Nira Conn, MD 01/30/20 [phone]

## 2020-01-30 NOTE — Progress Notes (Signed)
Inpatient Diabetes Program Recommendations  AACE/ADA: New Consensus Statement on Inpatient Glycemic Control (2015)  Target Ranges:  Prepandial:   less than 140 mg/dL      Peak postprandial:   less than 180 mg/dL (1-2 hours)      Critically ill patients:  140 - 180 mg/dL   Lab Results  Component Value Date   GLUCAP 220 (H) 01/30/2020    Review of Glycemic Control  Diabetes history: Prediabetes Outpatient Diabetes medications: None Current orders for Inpatient glycemic control: IV insulin  Inpatient Diabetes Program Recommendations:   Noted patient in emergency room just started on IV insulin drip. Will follow during hospitalization.  Thank you, Billy Fischer. Tamrah Victorino, RN, MSN, CDE  Diabetes Coordinator Inpatient Glycemic Control Team Team Pager [phone] (8am-5pm) 01/30/2020 2:54 PM

## 2020-01-30 NOTE — Sepsis Progress Note (Signed)
Notified bedside nurse of need to draw repeat lactic acid. 

## 2020-01-30 NOTE — ED Notes (Signed)
Patient placed on hospital bed, new pure wick applied.

## 2020-01-30 NOTE — ED Notes (Signed)
Lunch tray given. 

## 2020-01-30 NOTE — Progress Notes (Signed)
PHARMACY - PHYSICIAN COMMUNICATION CRITICAL VALUE ALERT - BLOOD CULTURE IDENTIFICATION (BCID)  Jillian Travis is an 70 y.o. female who presented to [facility] on 01/29/2020 with a chief complaint of fall  Assessment:  Pt growing Ecoli in blood. Urine is likely source  Name of physician (or Provider) Contacted: Dr Mike Craze  Current antibiotics: Rocephin 1gm IV q24h  Changes to prescribed antibiotics recommended:  Increase dose to 2gm IV q24h in bacteremia  Results for orders placed or performed during the hospital encounter of 01/29/20  Blood Culture ID Panel (Reflexed) (Collected: 01/30/2020  3:20 AM)  Result Value Ref Range   Enterococcus faecalis NOT DETECTED NOT DETECTED   Enterococcus Faecium NOT DETECTED NOT DETECTED   Listeria monocytogenes NOT DETECTED NOT DETECTED   Staphylococcus species NOT DETECTED NOT DETECTED   Staphylococcus aureus (BCID) NOT DETECTED NOT DETECTED   Staphylococcus epidermidis NOT DETECTED NOT DETECTED   Staphylococcus lugdunensis NOT DETECTED NOT DETECTED   Streptococcus species NOT DETECTED NOT DETECTED   Streptococcus agalactiae NOT DETECTED NOT DETECTED   Streptococcus pneumoniae NOT DETECTED NOT DETECTED   Streptococcus pyogenes NOT DETECTED NOT DETECTED   A.calcoaceticus-baumannii NOT DETECTED NOT DETECTED   Bacteroides fragilis NOT DETECTED NOT DETECTED   Enterobacterales DETECTED (A) NOT DETECTED   Enterobacter cloacae complex NOT DETECTED NOT DETECTED   Escherichia coli DETECTED (A) NOT DETECTED   Klebsiella aerogenes NOT DETECTED NOT DETECTED   Klebsiella oxytoca NOT DETECTED NOT DETECTED   Klebsiella pneumoniae NOT DETECTED NOT DETECTED   Proteus species NOT DETECTED NOT DETECTED   Salmonella species NOT DETECTED NOT DETECTED   Serratia marcescens NOT DETECTED NOT DETECTED   Haemophilus influenzae NOT DETECTED NOT DETECTED   Neisseria meningitidis NOT DETECTED NOT DETECTED   Pseudomonas aeruginosa NOT DETECTED NOT DETECTED    Stenotrophomonas maltophilia NOT DETECTED NOT DETECTED   Candida albicans NOT DETECTED NOT DETECTED   Candida auris NOT DETECTED NOT DETECTED   Candida glabrata NOT DETECTED NOT DETECTED   Candida krusei NOT DETECTED NOT DETECTED   Candida parapsilosis NOT DETECTED NOT DETECTED   Candida tropicalis NOT DETECTED NOT DETECTED   Cryptococcus neoformans/gattii NOT DETECTED NOT DETECTED   CTX-M ESBL NOT DETECTED NOT DETECTED   Carbapenem resistance IMP NOT DETECTED NOT DETECTED   Carbapenem resistance KPC NOT DETECTED NOT DETECTED   Carbapenem resistance NDM NOT DETECTED NOT DETECTED   Carbapenem resist OXA 48 LIKE NOT DETECTED NOT DETECTED   Carbapenem resistance VIM NOT DETECTED NOT DETECTED    Christoper Fabian, PharmD, BCPS Please see amion for complete clinical pharmacist phone list 01/30/2020  11:34 PM

## 2020-01-30 NOTE — ED Notes (Signed)
Pt dryheaving in lobby, pulled back into triage, IV placed, verbal orders obtained for zofran and fluids.

## 2020-01-30 NOTE — H&P (Addendum)
History and Physical    Jillian Travis ZOX:[id number] DOB: [birth year] DOA: 01/29/2020  Referring MD/NP/PA: John Giovanni, MD PCP: Kaleen Mask, MD  Patient coming from: Home  Chief Complaint: Weakness  I have personally briefly reviewed patient's old medical records in [facility]   HPI: Jillian Travis is a 69 y.o. female with medical history significant of prediabetes and hyperlipidemia presents with complaints of weakness.  History is obtained from patient with use of interpreter services and review of records.  There were reports that the patient had fallen over a dog at home approximately 5 days ago and had seen her primary care provider the following day.  Lab work was obtained which revealed signs of a leukocytosis with hyperglycemia and concern for urinary tract infection.  Patient reports that since that time she has been very sleepy and weak and was not even able to walk without assistance.  Noted associated symptoms of some abdominal pain.  Patient husband notes that she is become very small and skinny over the last few years, but had never been previously diagnosed with diabetes.  ED Course: Upon admission into the emergency department patient was noted to be afebrile, in atrial fibrillation with RVR at 200 bpm, respirations 14-28, blood pressures 91/58 -117/69, and all other vital signs maintained.  CT scan of the brain showed no acute abnormalities.  Labs significant for WBC 17.1, platelets 29, sodium 127->132, potassium 3.6-> 5.3 BUN 51->57, creatinine 1.86-> 1.98, glucose 567-> 547, calcium 8.5-> 7.9, anion gap 27->28, beta hydroxybutyrate > 8, and lactic acid 2.6.  Venous pH 7.274.  Urinalysis was positive for glucose, ketones, and trace leukocytes.  Sepsis protocol has been initiated with blood cultures  obtained 2.36 L of normal saline IV fluids, and Rocephin given.  Patient was started on hyperglycemic protocol with insulin drip.   Review of Systems  Constitutional:  Positive for weight loss. Negative for fever.  HENT: Negative for ear discharge and nosebleeds.   Eyes: Negative for photophobia and pain.  Cardiovascular: Negative for chest pain and leg swelling.  Gastrointestinal: Positive for abdominal pain. Negative for nausea and vomiting.  Genitourinary: Negative for dysuria and hematuria.  Musculoskeletal: Positive for falls.  Neurological: Positive for weakness. Negative for focal weakness.  Psychiatric/Behavioral: Negative for substance abuse.    Past Medical History:  Diagnosis Date  . Diabetes (HCC)   . HLD (hyperlipidemia)     History reviewed. No pertinent surgical history.   reports that she has never smoked. She has never used smokeless tobacco. She reports that she does not drink alcohol. No history on file for drug use.  No Known Allergies  History reviewed. No pertinent family history.  Prior to Admission medications   Medication Sig Start Date End Date Taking? Authorizing Provider  HYDROcodone-acetaminophen (NORCO/VICODIN) 5-325 MG tablet Take 1 tablet by mouth every 4 (four) hours as needed. 04/27/17   Elisha Ponder, PA-C    Physical Exam:  Constitutional: Thin frail elderly female who appears to be in no acute distress at this time Vitals:   01/30/20 0135 01/30/20 0400 01/30/20 0600 01/30/20 0700  BP: (!) 101/48 (!) 106/53 101/69 115/72  Pulse: 88 95 98 98  Resp: 14 18 (!) 26 (!) 28  Temp:      TempSrc:      SpO2: 98% 98% 94% 94%   Eyes: PERRL, lids and conjunctivae normal ENMT: Mucous membranes are dry. Posterior pharynx clear of any exudate or lesions.   Neck: normal, supple,  no masses, no thyromegaly Respiratory: clear to auscultation bilaterally, no wheezing, no crackles. Normal respiratory effort. No accessory muscle use.  Cardiovascular: Regular rate and rhythm, no murmurs / rubs / gallops. No extremity edema. 2+ pedal pulses. No carotid bruits.  Abdomen: no tenderness, no masses palpated. No  hepatosplenomegaly. Bowel sounds positive.  Musculoskeletal: no clubbing / cyanosis. No joint deformity upper and lower extremities. Good ROM, no contractures. Normal muscle tone.  Skin: no rashes, lesions, ulcers. No induration Neurologic: CN 2-12 grossly intact. Sensation intact, DTR normal. Strength 5/5 in all 4.  Psychiatric: Normal judgment and insight. Alert and oriented x person and place at this time. Normal mood.     Labs on Admission: I have personally reviewed following labs and imaging studies  CBC: Recent Labs  Lab 01/29/20 1740 01/30/20 0452  WBC 17.1*  --   HGB 14.8 16.7*  HCT 46.1* 49.0*  MCV 86.2  --   PLT 29*  --    Basic Metabolic Panel: Recent Labs  Lab 01/29/20 1740 01/30/20 0452  NA 127* 130*  K 3.6 4.6  CL 83*  --   CO2 17*  --   GLUCOSE 567*  --   BUN 51*  --   CREATININE 1.86*  --   CALCIUM 8.5*  --    GFR: CrCl cannot be calculated (Unknown ideal weight.). Liver Function Tests: No results for input(s): AST, ALT, ALKPHOS, BILITOT, PROT, ALBUMIN in the last 168 hours. No results for input(s): LIPASE, AMYLASE in the last 168 hours. No results for input(s): AMMONIA in the last 168 hours. Coagulation Profile: No results for input(s): INR, PROTIME in the last 168 hours. Cardiac Enzymes: No results for input(s): CKTOTAL, CKMB, CKMBINDEX, TROPONINI in the last 168 hours. BNP (last 3 results) No results for input(s): PROBNP in the last 8760 hours. HbA1C: No results for input(s): HGBA1C in the last 72 hours. CBG: Recent Labs  Lab 01/30/20 0502 01/30/20 0534 01/30/20 0606 01/30/20 0713 01/30/20 0823  GLUCAP 472* 431* 353* 262* 208*   Lipid Profile: No results for input(s): CHOL, HDL, LDLCALC, TRIG, CHOLHDL, LDLDIRECT in the last 72 hours. Thyroid Function Tests: No results for input(s): TSH, T4TOTAL, FREET4, T3FREE, THYROIDAB in the last 72 hours. Anemia Panel: No results for input(s): VITAMINB12, FOLATE, FERRITIN, TIBC, IRON, RETICCTPCT  in the last 72 hours. Urine analysis:    Component Value Date/Time   COLORURINE YELLOW 01/30/2020 0439   APPEARANCEUR CLEAR 01/30/2020 0439   LABSPEC 1.016 01/30/2020 0439   PHURINE 5.0 01/30/2020 0439   GLUCOSEU >=500 (A) 01/30/2020 0439   HGBUR MODERATE (A) 01/30/2020 0439   BILIRUBINUR NEGATIVE 01/30/2020 0439   KETONESUR 80 (A) 01/30/2020 0439   PROTEINUR NEGATIVE 01/30/2020 0439   NITRITE NEGATIVE 01/30/2020 0439   LEUKOCYTESUR TRACE (A) 01/30/2020 0439   Sepsis Labs: Recent Results (from the past 240 hour(s))  SARS Coronavirus 2 by RT PCR (hospital order, [facility]) Nasopharyngeal Nasopharyngeal Swab     Status: None   Collection Time: 01/29/20 10:55 AM   Specimen: Nasopharyngeal Swab  Result Value Ref Range Status   SARS Coronavirus 2 NEGATIVE NEGATIVE Final    Comment: (NOTE) SARS-CoV-2 target nucleic acids are NOT DETECTED.  The SARS-CoV-2 RNA is generally detectable in upper and lower respiratory specimens during the acute phase of infection. The lowest concentration of SARS-CoV-2 viral copies this assay can detect is 250 copies / mL. A negative result does not preclude SARS-CoV-2 infection and should not be used  as the sole basis for treatment or other patient management decisions.  A negative result may occur with improper specimen collection / handling, submission of specimen other than nasopharyngeal swab, presence of viral mutation(s) within the areas targeted by this assay, and inadequate number of viral copies (<250 copies / mL). A negative result must be combined with clinical observations, patient history, and epidemiological information.  Fact Sheet for Patients:   BoilerBrush.com.cy  Fact Sheet for Healthcare Providers: https://pope.com/  This test is not yet approved or  cleared by the [place] FDA and has been authorized for detection and/or diagnosis of SARS-CoV-2  by FDA under an Emergency Use Authorization (EUA).  This EUA will remain in effect (meaning this test can be used) for the duration of the COVID-19 declaration under Section 564(b)(1) of the Act, 21 U.S.C. section 360bbb-3(b)(1), unless the authorization is terminated or revoked sooner.  Performed at [facility], [address], [place], [place] [address]   SARS Coronavirus 2 by RT PCR (hospital order, performed in [facility] hospital lab) Nasopharyngeal Nasopharyngeal Swab     Status: None   Collection Time: 01/29/20 11:58 PM   Specimen: Nasopharyngeal Swab  Result Value Ref Range Status   SARS Coronavirus 2 NEGATIVE NEGATIVE Final    Comment: (NOTE) SARS-CoV-2 target nucleic acids are NOT DETECTED.  The SARS-CoV-2 RNA is generally detectable in upper and lower respiratory specimens during the acute phase of infection. The lowest concentration of SARS-CoV-2 viral copies this assay can detect is 250 copies / mL. A negative result does not preclude SARS-CoV-2 infection and should not be used as the sole basis for treatment or other patient management decisions.  A negative result may occur with improper specimen collection / handling, submission of specimen other than nasopharyngeal swab, presence of viral mutation(s) within the areas targeted by this assay, and inadequate number of viral copies (<250 copies / mL). A negative result must be combined with clinical observations, patient history, and epidemiological information.  Fact Sheet for Patients:   BoilerBrush.com.cy  Fact Sheet for Healthcare Providers: https://pope.com/  This test is not yet approved or  cleared by the [place] FDA and has been authorized for detection and/or diagnosis of SARS-CoV-2 by FDA under an Emergency Use Authorization (EUA).  This EUA will remain in effect (meaning this test can be used) for the duration of the COVID-19 declaration under  Section 564(b)(1) of the Act, 21 U.S.C. section 360bbb-3(b)(1), unless the authorization is terminated or revoked sooner.  Performed at [facility], [address], [place], [place] [address]      Radiological Exams on Admission: DG Chest 2 View  Result Date: 01/29/2020 CLINICAL DATA:  69 year old female with history of weakness. EXAM: CHEST - 2 VIEW COMPARISON:  Chest x-ray 06/30/2016. FINDINGS: Lung volumes are normal. No consolidative airspace disease. No pleural effusions. No pneumothorax. No pulmonary nodule or mass noted. Pulmonary vasculature and the cardiomediastinal silhouette are within normal limits. Atherosclerosis in the thoracic aorta. IMPRESSION: 1.  No radiographic evidence of acute cardiopulmonary disease. 2. Aortic atherosclerosis. Electronically Signed   By: Trudie Reed M.D.   On: 01/29/2020 12:03   CT Head Wo Contrast  Result Date: 01/29/2020 CLINICAL DATA:  Head trauma. EXAM: CT HEAD WITHOUT CONTRAST TECHNIQUE: Contiguous axial images were obtained from the base of the skull through the vertex without intravenous contrast. COMPARISON:  None. FINDINGS: Brain: No evidence of acute infarction, hemorrhage, hydrocephalus, extra-axial collection or mass lesion/mass effect. Vascular: No hyperdense  vessel or unexpected calcification. Skull: Normal. Negative for fracture or focal lesion. Sinuses/Orbits: Globes and orbits are unremarkable. The visualized sinuses and mastoid air cells are clear. Other: None. IMPRESSION: Negative unenhanced CT scan of the brain. Electronically Signed   By: Amie Portland M.D.   On: 01/29/2020 19:03    EKG: Independently reviewed.  Atrial fibrillation with RVR at 200 bpm  Assessment/Plan DKA, type II: Acute.  Patient presented with glucose elevated up to 567, CO2 14, anion gap of 28, and venous pH 7.274.  Urinalysis was significant for glucose and ketones.  She had been bolused IV fluids and started on insulin drip per protocol.  Patient  appears to be more alert and awake at this time. -Admit to a progressive bed -Hyperglycemia admission order set utilized -Insulin drip per protocol -BMPs every 4 hours -Monitoring for gap closure and we will transition to subcutaneous insulin with plans to transition to subcutaneous insulin when medically appropriate -Diabetic educator consulted  SIRS/sepsis: Acute.  Patient presented in A. fib with RVR up to 200bpm with   tachypnea, WBC 17.1, and lactic acid 2.6.  Chest x-ray showed no acute abnormalities.  Reported to have signs of a urinary tract infection at PCP office, but urinalysis on admission does not show clear signs of infection.  Patient had initially been given Rocephin IV.   -Follow-up blood cultures -Continue Rocephin IV IV for now   Acute renal failure: Patient baseline creatinine previously noted to be around 0.7, but patient presents with creatinine elevated up to 1.98 and BUN elevated to 57.  The elevated BUN to creatinine ratio suggest a prerenal cause of symptoms. -Continue IV fluids -Check renal ultrasound -Hold nephrotoxic agents  Generalized weakness and recent fall: Patient reports recent fall and being very weak and unable to walk at home prior to arrival.  -PT/OT to eval and treat  Atrial fibrillation with RVR: Acute.  On admission patient was noted to be in A. fib with RVR with heart rates into the 200s.  Converted back into normal sinus rhythm after IV fluids. -Add on TSH  -Check echocardiogram -Goal potassium 4 and magnesium  Thrombocytopenia: Acute.  Patient's initial platelet count noted to be as low as 29, but previously had been within normal limits back in 2018.  No history of alcohol use. -Check LDH and DIC panel stat  Malnutrition: Acute.  Patient appears to be malnourished on physical exam. -Check prealbumin  DVT prophylaxis: SCDs Code Status: Full Family Communication: Husband updated over phone Disposition Plan: Likely discharge home once  medically stable Consults called: Diabetic educator Admission status: Inpatient requiring more than 2 midnight stay given hyperglycemia with need for insulin drip  Clydie Braun MD [facility] Pager [phone]   If 7PM-7AM, please contact night-coverage [email or web address] Password [email or web address]  01/30/2020, 8:59 AM

## 2020-01-30 NOTE — Progress Notes (Signed)
Patient arrived to [address] from ED. Patient [id number]. Admission assessment complete. Patient placed in low bed. Plan of care explained to patient. Will continue to monitor patient.

## 2020-01-30 NOTE — ED Notes (Signed)
Paged floor coverage about D/C'ing insulin drip per Dr. Katrinka Blazing.

## 2020-01-30 NOTE — ED Notes (Signed)
CBG 558

## 2020-01-31 ENCOUNTER — Inpatient Hospital Stay (HOSPITAL_COMMUNITY): Payer: Medicare Other

## 2020-01-31 DIAGNOSIS — R6520 Severe sepsis without septic shock: Secondary | ICD-10-CM

## 2020-01-31 DIAGNOSIS — I351 Nonrheumatic aortic (valve) insufficiency: Secondary | ICD-10-CM

## 2020-01-31 DIAGNOSIS — A419 Sepsis, unspecified organism: Secondary | ICD-10-CM

## 2020-01-31 LAB — MAGNESIUM: Magnesium: 1.7 mg/dL (ref 1.7–2.4)

## 2020-01-31 LAB — CBC
HCT: 46.1 % — ABNORMAL HIGH (ref 36.0–46.0)
Hemoglobin: 14.8 g/dL (ref 12.0–15.0)
MCH: 27.7 pg (ref 26.0–34.0)
MCHC: 32.1 g/dL (ref 30.0–36.0)
MCV: 86.2 fL (ref 80.0–100.0)
Platelets: 29 10*3/uL — CL (ref 150–400)
RBC: 5.35 MIL/uL — ABNORMAL HIGH (ref 3.87–5.11)
RDW: 12.9 % (ref 11.5–15.5)
WBC: 17.1 10*3/uL — ABNORMAL HIGH (ref 4.0–10.5)
nRBC: 0 % (ref 0.0–0.2)

## 2020-01-31 LAB — ECHOCARDIOGRAM COMPLETE
Area-P 1/2: 2.62 cm2
P 1/2 time: 494 msec
S' Lateral: 3.2 cm
Weight: 1952 oz

## 2020-01-31 LAB — BASIC METABOLIC PANEL
Anion gap: 9 (ref 5–15)
Anion gap: 10 (ref 5–15)
BUN: 26 mg/dL — ABNORMAL HIGH (ref 8–23)
BUN: 24 mg/dL — ABNORMAL HIGH (ref 8–23)
CO2: 25 mmol/L (ref 22–32)
CO2: 24 mmol/L (ref 22–32)
Calcium: 8.4 mg/dL — ABNORMAL LOW (ref 8.9–10.3)
Calcium: 8 mg/dL — ABNORMAL LOW (ref 8.9–10.3)
Chloride: 107 mmol/L (ref 98–111)
Chloride: 106 mmol/L (ref 98–111)
Creatinine, Ser: 0.76 mg/dL (ref 0.44–1.00)
Creatinine, Ser: 0.78 mg/dL (ref 0.44–1.00)
GFR calc Af Amer: >60 mL/min (ref >60)
GFR calc Af Amer: >60 mL/min (ref >60)
GFR calc non Af Amer: >60 mL/min (ref >60)
GFR calc non Af Amer: >60 mL/min (ref >60)
Glucose, Bld: 169 mg/dL — ABNORMAL HIGH (ref 70–99)
Glucose, Bld: 239 mg/dL — ABNORMAL HIGH (ref 70–99)
Potassium: 3.5 mmol/L (ref 3.5–5.1)
Potassium: 4.3 mmol/L (ref 3.5–5.1)
Sodium: 141 mmol/L (ref 135–145)
Sodium: 140 mmol/L (ref 135–145)

## 2020-01-31 LAB — GLUCOSE, CAPILLARY
Glucose-Capillary: 103 mg/dL — ABNORMAL HIGH (ref 70–99)
Glucose-Capillary: 198 mg/dL — ABNORMAL HIGH (ref 70–99)
Glucose-Capillary: 213 mg/dL — ABNORMAL HIGH (ref 70–99)
Glucose-Capillary: 238 mg/dL — ABNORMAL HIGH (ref 70–99)
Glucose-Capillary: 286 mg/dL — ABNORMAL HIGH (ref 70–99)

## 2020-01-31 LAB — PREALBUMIN: Prealbumin: <5 mg/dL — ABNORMAL LOW (ref 18–38)

## 2020-01-31 MED ORDER — IOHEXOL 9 MG/ML PO SOLN
500.0000 mL | ORAL | Status: AC
  Administered 2020-01-31 (×2): 500 mL via ORAL

## 2020-01-31 MED ORDER — INSULIN GLARGINE 100 UNIT/ML ~~LOC~~ SOLN
18.0000 [IU] | Freq: Every day | SUBCUTANEOUS | Status: DC
  Administered 2020-01-31 – 2020-02-01 (×2): 18 [IU] via SUBCUTANEOUS
  Filled 2020-01-31 (×3): qty 0.18

## 2020-01-31 NOTE — Progress Notes (Signed)
  Echocardiogram 2D Echocardiogram has been performed.  Delcie Roch 01/31/2020, 3:34 PM

## 2020-01-31 NOTE — Progress Notes (Signed)
Insulin drip stopped. CBG 103. No sliding scale coverage required. Will continue to monitor patient.

## 2020-01-31 NOTE — Progress Notes (Signed)
PT Cancellation Note  Patient Details Name: URIEL HORKEY MRN: [id number] DOB: [birth year]   Cancelled Treatment:    Reason Eval/Treat Not Completed: Other (comment).  Attempted to use interpretation services and could not get a video interpreter, but instead an audio was provided.  Pt speaks a little [place], stated she could not hear despite responding to first hello greeting from the interpreter.  Made several attempts to try to be understood but finally pt rolled over and closed her eyes.  Retry at another time.   Ivar Drape 01/31/2020, 5:05 PM   Samul Dada, PT MS Acute Rehab Dept. Number: [facility] [id number] and [facility] [phone]

## 2020-01-31 NOTE — Progress Notes (Addendum)
PROGRESS NOTE    Jillian Travis   ZOX:[id number]  DOB: 07-13-1950  DOA: 01/29/2020 PCP: Kaleen Mask, MD   Brief Narrative:  Jillian Travis a 69 y.o. female with medical history significant of prediabetes and hyperlipidemia presents with complaints of weakness.  History is obtained from patient with use of interpreter services and review of records.  There were reports that the patient had fallen over a dog at home approximately 5 days ago and had seen her primary care provider the following day. Lab work was obtained which revealed signs of a leukocytosis with hyperglycemia and concern for urinary tract infection.  Patient reports that since that time she has been very sleepy and weak and was not even able to walk without assistance.  Noted associated symptoms of some abdominal pain.  Patient husband notes that she is become very small and skinny over the last few years, but had never been previously diagnosed with diabetes. In the ED she was found to have DKA.  CBG>600 pH 7.274 B- hydroxybutyric acid > 8 Lactic acid 2.6  Subjective: No complaints.     Assessment & Plan:   Principal Problem:   DKA (diabetic ketoacidoses) - new diagnosis of diabetes - treated with insulin infusion and IVF - A1c 16.7 - CBG in 200s range right now- increase Lantus to 18 units QHS-cont SSI - obtain consult with dietician and diabetes coordinator  Active Problems:   Acute renal failure - prerenal - Cr 1.98 >> 0.78- cont IVF  E coli bacteremia, sepsis - gram neg rods in 1 set of cultures - BCID > E coli  - ? If source is urine as HPI mentions that her PCP may have noted she had a UTI  - UA in ED showed mild bacteria, did not show any WBC or nitrites - cont Ceftriaxone       Thrombocytopenia   - likely a result of gram neg sepsis  Severe weight loss - may be related to uncontrolled DM but I will obtain CT scans to r/o underlying malignancy when Cr improves  Time spent in minutes: 35 min DVT  prophylaxis: SCDs Start: 01/30/20 8119  Code Status: full code Family Communication: I have tried reaching her daughter and the other contact listed on her face sheet but no one is picking up the call Disposition Plan:  Status is: Inpatient  Remains inpatient appropriate because:sepsis on IV Antibiotics   Dispo: The patient is from: Home              Anticipated d/c is to: Home              Anticipated d/c date is: 2 days              Patient currently is not medically stable to d/c.  Consultants:   none Procedures:   none Antimicrobials:  Anti-infectives (From admission, onward)   Start     Dose/Rate Route Frequency Ordered Stop   01/31/20 1000  cefTRIAXone (ROCEPHIN) 1 g in sodium chloride 0.9 % 100 mL IVPB  Status:  Discontinued        1 g 200 mL/hr over 30 Minutes Intravenous Every 24 hours 01/30/20 1528 01/30/20 2336   01/31/20 0030  cefTRIAXone (ROCEPHIN) 2 g in sodium chloride 0.9 % 100 mL IVPB        2 g 200 mL/hr over 30 Minutes Intravenous Daily at bedtime 01/30/20 2336     01/30/20 0215  cefTRIAXone (ROCEPHIN) 1 g in  sodium chloride 0.9 % 100 mL IVPB        1 g 200 mL/hr over 30 Minutes Intravenous  Once 01/30/20 0209 01/30/20 0403       Objective: Vitals:   01/31/20 0052 01/31/20 0344 01/31/20 0800 01/31/20 1133  BP: 122/65 (!) 104/59 130/71 139/68  Pulse: 79 70    Resp: 19 19 18 18   Temp:  98.6 F (37 C) 97.8 F (36.6 C) 98 F (36.7 C)  TempSrc:  Oral Skin Skin  SpO2:  98% 100% 100%  Weight:  55.3 kg      Intake/Output Summary (Last 24 hours) at 01/31/2020 1309 Last data filed at 01/31/2020 0600 Gross per 24 hour  Intake 3329.41 ml  Output --  Net 3329.41 ml   Filed Weights   01/30/20 2316 01/31/20 0344  Weight: 55.3 kg 55.3 kg    Examination: General exam: Appears comfortable  HEENT: PERRLA, oral mucosa moist, no sclera icterus or thrush- face has blotchy erythema Respiratory system: Clear to auscultation. Respiratory effort  normal. Cardiovascular system: S1 & S2 heard, RRR.   Gastrointestinal system: Abdomen soft, non-tender, nondistended. Normal bowel sounds. Central nervous system: Alert and oriented. No focal neurological deficits. Extremities: No cyanosis, clubbing or edema Skin: No rashes or ulcers Psychiatry:  Mood & affect appropriate.     Data Reviewed: I have personally reviewed following labs and imaging studies  CBC: Recent Labs  Lab 01/29/20 1740 01/30/20 0452 01/30/20 0945  WBC 17.1*  --  16.2*  NEUTROABS  --   --  12.7*  HGB 14.8 16.7* 13.1  HCT 46.1* 49.0* 40.3  MCV 86.2  --  84.5  PLT 29*  --  25*  25*   Basic Metabolic Panel: Recent Labs  Lab 01/30/20 0338 01/30/20 0338 01/30/20 0452 01/30/20 0945 01/30/20 1704 01/31/20 0118 01/31/20 0619  NA 132*   < > 130* 142 143 141 140  K 5.3*   < > 4.6 3.0* 3.2* 3.5 4.3  CL 90*  --   --  104 108 107 106  CO2 14*  --   --  26 22 25 24   GLUCOSE 547*  --   --  206* 204* 169* 239*  BUN 57*  --   --  38* 35* 26* 24*  CREATININE 1.98*  --   --  1.16* 0.94 0.76 0.78  CALCIUM 7.9*  --   --  7.8* 8.0* 8.4* 8.0*  MG  --   --   --  1.9  --   --  1.7   < > = values in this interval not displayed.   GFR: CrCl cannot be calculated (Unknown ideal weight.). Liver Function Tests: No results for input(s): AST, ALT, ALKPHOS, BILITOT, PROT, ALBUMIN in the last 168 hours. No results for input(s): LIPASE, AMYLASE in the last 168 hours. No results for input(s): AMMONIA in the last 168 hours. Coagulation Profile: Recent Labs  Lab 01/30/20 0945  INR 1.1   Cardiac Enzymes: No results for input(s): CKTOTAL, CKMB, CKMBINDEX, TROPONINI in the last 168 hours. BNP (last 3 results) No results for input(s): PROBNP in the last 8760 hours. HbA1C: No results for input(s): HGBA1C in the last 72 hours. CBG: Recent Labs  Lab 01/30/20 2003 01/30/20 2226 01/31/20 0037 01/31/20 0603 01/31/20 1138  GLUCAP 234* 170* 103* 238* 213*   Lipid  Profile: No results for input(s): CHOL, HDL, LDLCALC, TRIG, CHOLHDL, LDLDIRECT in the last 72 hours. Thyroid Function Tests: Recent Labs  01/30/20 0945  TSH 0.012*   Anemia Panel: No results for input(s): VITAMINB12, FOLATE, FERRITIN, TIBC, IRON, RETICCTPCT in the last 72 hours. Urine analysis:    Component Value Date/Time   COLORURINE YELLOW 01/30/2020 0439   APPEARANCEUR CLEAR 01/30/2020 0439   LABSPEC 1.016 01/30/2020 0439   PHURINE 5.0 01/30/2020 0439   GLUCOSEU >=500 (A) 01/30/2020 0439   HGBUR MODERATE (A) 01/30/2020 0439   BILIRUBINUR NEGATIVE 01/30/2020 0439   KETONESUR 80 (A) 01/30/2020 0439   PROTEINUR NEGATIVE 01/30/2020 0439   NITRITE NEGATIVE 01/30/2020 0439   LEUKOCYTESUR TRACE (A) 01/30/2020 0439   Sepsis Labs: @LABRCNTIP (procalcitonin:4,lacticidven:4) ) Recent Results (from the past 240 hour(s))  SARS Coronavirus 2 by RT PCR (hospital order, performed in [facility] hospital lab) Nasopharyngeal Nasopharyngeal Swab     Status: None   Collection Time: 01/29/20 10:55 AM   Specimen: Nasopharyngeal Swab  Result Value Ref Range Status   SARS Coronavirus 2 NEGATIVE NEGATIVE Final    Comment: (NOTE) SARS-CoV-2 target nucleic acids are NOT DETECTED.  The SARS-CoV-2 RNA is generally detectable in upper and lower respiratory specimens during the acute phase of infection. The lowest concentration of SARS-CoV-2 viral copies this assay can detect is 250 copies / mL. A negative result does not preclude SARS-CoV-2 infection and should not be used as the sole basis for treatment or other patient management decisions.  A negative result may occur with improper specimen collection / handling, submission of specimen other than nasopharyngeal swab, presence of viral mutation(s) within the areas targeted by this assay, and inadequate number of viral copies (<250 copies / mL). A negative result must be combined with clinical observations, patient history, and epidemiological  information.  Fact Sheet for Patients:   BoilerBrush.com.cyhttps://www.fda.gov/media/136312/download  Fact Sheet for Healthcare Providers: https://pope.com/https://www.fda.gov/media/136313/download  This test is not yet approved or  cleared by the [place] FDA and has been authorized for detection and/or diagnosis of SARS-CoV-2 by FDA under an Emergency Use Authorization (EUA).  This EUA will remain in effect (meaning this test can be used) for the duration of the COVID-19 declaration under Section 564(b)(1) of the Act, 21 U.S.C. section 360bbb-3(b)(1), unless the authorization is terminated or revoked sooner.  Performed at [facility], [address] [place], [place] [address]   SARS Coronavirus 2 by RT PCR (hospital order, performed in [facility] hospital lab) Nasopharyngeal Nasopharyngeal Swab     Status: None   Collection Time: 01/29/20 11:58 PM   Specimen: Nasopharyngeal Swab  Result Value Ref Range Status   SARS Coronavirus 2 NEGATIVE NEGATIVE Final    Comment: (NOTE) SARS-CoV-2 target nucleic acids are NOT DETECTED.  The SARS-CoV-2 RNA is generally detectable in upper and lower respiratory specimens during the acute phase of infection. The lowest concentration of SARS-CoV-2 viral copies this assay can detect is 250 copies / mL. A negative result does not preclude SARS-CoV-2 infection and should not be used as the sole basis for treatment or other patient management decisions.  A negative result may occur with improper specimen collection / handling, submission of specimen other than nasopharyngeal swab, presence of viral mutation(s) within the areas targeted by this assay, and inadequate number of viral copies (<250 copies / mL). A negative result must be combined with clinical observations, patient history, and epidemiological information.  Fact Sheet for Patients:   BoilerBrush.com.cyhttps://www.fda.gov/media/136312/download  Fact Sheet for Healthcare  Providers: https://pope.com/https://www.fda.gov/media/136313/download  This test is not yet approved or  cleared by the [place] FDA and has  been authorized for detection and/or diagnosis of SARS-CoV-2 by FDA under an Emergency Use Authorization (EUA).  This EUA will remain in effect (meaning this test can be used) for the duration of the COVID-19 declaration under Section 564(b)(1) of the Act, 21 U.S.C. section 360bbb-3(b)(1), unless the authorization is terminated or revoked sooner.  Performed at [facility], [address] [place], [place] [address]   Blood culture (routine x 2)     Status: None (Preliminary result)   Collection Time: 01/30/20  3:20 AM   Specimen: BLOOD LEFT FOREARM  Result Value Ref Range Status   Specimen Description BLOOD LEFT FOREARM  Final   Special Requests   Final    BOTTLES DRAWN AEROBIC AND ANAEROBIC Blood Culture adequate volume   Culture  Setup Time   Final    GRAM NEGATIVE RODS Organism ID to follow CRITICAL RESULT CALLED TO, READ BACK BY AND VERIFIED WITH: K AMEND PHARMD 01/30/20 2329 JDW IN BOTH AEROBIC AND ANAEROBIC BOTTLES Performed at [facility], [address], [place], [place] [address]    Culture GRAM NEGATIVE RODS  Final   Report Status PENDING  Incomplete  Blood Culture ID Panel (Reflexed)     Status: Abnormal   Collection Time: 01/30/20  3:20 AM  Result Value Ref Range Status   Enterococcus faecalis NOT DETECTED NOT DETECTED Final   Enterococcus Faecium NOT DETECTED NOT DETECTED Final   Listeria monocytogenes NOT DETECTED NOT DETECTED Final   Staphylococcus species NOT DETECTED NOT DETECTED Final   Staphylococcus aureus (BCID) NOT DETECTED NOT DETECTED Final   Staphylococcus epidermidis NOT DETECTED NOT DETECTED Final   Staphylococcus lugdunensis NOT DETECTED NOT DETECTED Final   Streptococcus species NOT DETECTED NOT DETECTED Final   Streptococcus agalactiae NOT DETECTED NOT DETECTED Final   Streptococcus pneumoniae NOT DETECTED  NOT DETECTED Final   Streptococcus pyogenes NOT DETECTED NOT DETECTED Final   A.calcoaceticus-baumannii NOT DETECTED NOT DETECTED Final   Bacteroides fragilis NOT DETECTED NOT DETECTED Final   Enterobacterales DETECTED (A) NOT DETECTED Final    Comment: Enterobacterales represent a large order of gram negative bacteria, not a single organism. CRITICAL RESULT CALLED TO, READ BACK BY AND VERIFIED WITH: K AMEND PHARMD 01/30/20 2329 JDW    Enterobacter cloacae complex NOT DETECTED NOT DETECTED Final   Escherichia coli DETECTED (A) NOT DETECTED Final    Comment: CRITICAL RESULT CALLED TO, READ BACK BY AND VERIFIED WITH: K AMEND PHARMD 01/30/20 2329 JDW    Klebsiella aerogenes NOT DETECTED NOT DETECTED Final   Klebsiella oxytoca NOT DETECTED NOT DETECTED Final   Klebsiella pneumoniae NOT DETECTED NOT DETECTED Final   Proteus species NOT DETECTED NOT DETECTED Final   Salmonella species NOT DETECTED NOT DETECTED Final   Serratia marcescens NOT DETECTED NOT DETECTED Final   Haemophilus influenzae NOT DETECTED NOT DETECTED Final   Neisseria meningitidis NOT DETECTED NOT DETECTED Final   Pseudomonas aeruginosa NOT DETECTED NOT DETECTED Final   Stenotrophomonas maltophilia NOT DETECTED NOT DETECTED Final   Candida albicans NOT DETECTED NOT DETECTED Final   Candida auris NOT DETECTED NOT DETECTED Final   Candida glabrata NOT DETECTED NOT DETECTED Final   Candida krusei NOT DETECTED NOT DETECTED Final   Candida parapsilosis NOT DETECTED NOT DETECTED Final   Candida tropicalis NOT DETECTED NOT DETECTED Final   Cryptococcus neoformans/gattii NOT DETECTED NOT DETECTED Final   CTX-M ESBL NOT DETECTED NOT DETECTED Final   Carbapenem resistance IMP NOT DETECTED NOT DETECTED Final  Carbapenem resistance KPC NOT DETECTED NOT DETECTED Final   Carbapenem resistance NDM NOT DETECTED NOT DETECTED Final   Carbapenem resist OXA 48 LIKE NOT DETECTED NOT DETECTED Final   Carbapenem resistance VIM NOT DETECTED NOT  DETECTED Final    Comment: Performed at [facility], [address], [place], [place] [address]  Culture, blood (single)     Status: None (Preliminary result)   Collection Time: 01/31/20  1:17 AM   Specimen: BLOOD  Result Value Ref Range Status   Specimen Description BLOOD LEFT ANTECUBITAL  Final   Special Requests   Final    BOTTLES DRAWN AEROBIC ONLY Blood Culture adequate volume Performed at [facility], [address] [place], [place] [address]    Culture PENDING  Incomplete   Report Status PENDING  Incomplete         Radiology Studies: CT Head Wo Contrast  Result Date: 01/29/2020 CLINICAL DATA:  Head trauma. EXAM: CT HEAD WITHOUT CONTRAST TECHNIQUE: Contiguous axial images were obtained from the base of the skull through the vertex without intravenous contrast. COMPARISON:  None. FINDINGS: Brain: No evidence of acute infarction, hemorrhage, hydrocephalus, extra-axial collection or mass lesion/mass effect. Vascular: No hyperdense vessel or unexpected calcification. Skull: Normal. Negative for fracture or focal lesion. Sinuses/Orbits: Globes and orbits are unremarkable. The visualized sinuses and mastoid air cells are clear. Other: None. IMPRESSION: Negative unenhanced CT scan of the brain. Electronically Signed   By: Amie Portland M.D.   On: 01/29/2020 19:03      Scheduled Meds: . insulin aspart  0-15 Units Subcutaneous TID WC  . insulin aspart  0-5 Units Subcutaneous QHS  . insulin glargine  10 Units Subcutaneous QHS   Continuous Infusions: . sodium chloride 125 mL/hr at 01/31/20 1152  . cefTRIAXone (ROCEPHIN)  IV Stopped (01/31/20 0104)     LOS: 1 day      Calvert Cantor, MD [facility] Pager: [email or web address] 01/31/2020, 1:09 PM

## 2020-02-01 LAB — BASIC METABOLIC PANEL
Anion gap: 5 (ref 5–15)
BUN: 13 mg/dL (ref 8–23)
CO2: 25 mmol/L (ref 22–32)
Calcium: 7.5 mg/dL — ABNORMAL LOW (ref 8.9–10.3)
Chloride: 109 mmol/L (ref 98–111)
Creatinine, Ser: 0.56 mg/dL (ref 0.44–1.00)
GFR calc Af Amer: >60 mL/min (ref >60)
GFR calc non Af Amer: >60 mL/min (ref >60)
Glucose, Bld: 154 mg/dL — ABNORMAL HIGH (ref 70–99)
Potassium: 3.3 mmol/L — ABNORMAL LOW (ref 3.5–5.1)
Sodium: 139 mmol/L (ref 135–145)

## 2020-02-01 LAB — CBC
HCT: 39.1 % (ref 36.0–46.0)
Hemoglobin: 12.7 g/dL (ref 12.0–15.0)
MCH: 27.6 pg (ref 26.0–34.0)
MCHC: 32.5 g/dL (ref 30.0–36.0)
MCV: 85 fL (ref 80.0–100.0)
Platelets: 53 10*3/uL — ABNORMAL LOW (ref 150–400)
RBC: 4.6 MIL/uL (ref 3.87–5.11)
RDW: 13.2 % (ref 11.5–15.5)
WBC: 8.1 10*3/uL (ref 4.0–10.5)
nRBC: 0 % (ref 0.0–0.2)

## 2020-02-01 LAB — GLUCOSE, CAPILLARY
Glucose-Capillary: 129 mg/dL — ABNORMAL HIGH (ref 70–99)
Glucose-Capillary: 243 mg/dL — ABNORMAL HIGH (ref 70–99)
Glucose-Capillary: 329 mg/dL — ABNORMAL HIGH (ref 70–99)
Glucose-Capillary: 299 mg/dL — ABNORMAL HIGH (ref 70–99)

## 2020-02-01 MED ORDER — CEFAZOLIN SODIUM-DEXTROSE 2-4 GM/100ML-% IV SOLN
2.0000 g | Freq: Three times a day (TID) | INTRAVENOUS | Status: DC
  Administered 2020-02-01 – 2020-02-02 (×4): 2 g via INTRAVENOUS
  Filled 2020-02-01 (×4): qty 100

## 2020-02-01 MED ORDER — MORPHINE SULFATE (PF) 2 MG/ML IV SOLN
2.0000 mg | Freq: Once | INTRAVENOUS | Status: AC
  Administered 2020-02-01: 2 mg via INTRAVENOUS
  Filled 2020-02-01: qty 1

## 2020-02-01 MED ORDER — INSULIN ASPART 100 UNIT/ML ~~LOC~~ SOLN
5.0000 [IU] | Freq: Once | SUBCUTANEOUS | Status: AC
  Administered 2020-02-01: 5 [IU] via SUBCUTANEOUS

## 2020-02-01 MED ORDER — ACETAMINOPHEN 325 MG PO TABS: 650.0000 mg | ORAL_TABLET | Freq: Four times a day (QID) | ORAL | Status: DC | PRN

## 2020-02-01 MED ORDER — POTASSIUM CHLORIDE CRYS ER 20 MEQ PO TBCR
40.0000 meq | EXTENDED_RELEASE_TABLET | Freq: Once | ORAL | Status: AC
  Administered 2020-02-01: 40 meq via ORAL
  Filled 2020-02-01: qty 2

## 2020-02-01 MED ORDER — INSULIN ASPART 100 UNIT/ML ~~LOC~~ SOLN
3.0000 [IU] | Freq: Three times a day (TID) | SUBCUTANEOUS | Status: DC
  Administered 2020-02-01 – 2020-02-02 (×3): 3 [IU] via SUBCUTANEOUS

## 2020-02-01 MED ORDER — SODIUM CHLORIDE 0.9 % IV SOLN
INTRAVENOUS | Status: DC | PRN
  Administered 2020-02-01 – 2020-02-02 (×2): 250 mL via INTRAVENOUS

## 2020-02-01 NOTE — Progress Notes (Signed)
PROGRESS NOTE    Jillian Travis   GEX:[id number]  DOB: 04/18/1951  DOA: 01/29/2020 PCP: Kaleen MaskElkins, Wilson Oliver, MD   Brief Narrative:  Jillian Travis a 69 y.o. female with medical history significant of prediabetes and hyperlipidemia presents with complaints of weakness.  History is obtained from patient with use of interpreter services and review of records.  There were reports that the patient had fallen over a dog at home approximately 5 days ago and had seen her primary care provider the following day. Lab work was obtained which revealed signs of a leukocytosis with hyperglycemia and concern for urinary tract infection.  Patient reports that since that time she has been very sleepy and weak and was not even able to walk without assistance.  Noted associated symptoms of some abdominal pain.  Patient husband notes that she is become very small and skinny over the last few years, but had never been previously diagnosed with diabetes. In the ED she was found to have DKA.  CBG>600 pH 7.274 B- hydroxybutyric acid > 8 Lactic acid 2.6  Subjective: No complaints.     Assessment & Plan:   Principal Problem:   DKA (diabetic ketoacidoses) - new diagnosis of diabetes - treated with insulin infusion and IVF - A1c 16.7 - CBG in 200s range right now- increase Lantus to 18 units QHS-cont SSI - obtain consult with dietician and diabetes coordinator - I have spoken with her husband and outlined the treatments she will need for her DM- they will get education regarding insulin and diet  Active Problems:   Acute renal failure - prerenal - Cr 1.98 >> 0.78 >> 0.56- stop IVF today  Hypokalemia - replaced- follow tomorrow  E coli bacteremia, sepsis - gram neg rods in 1 set of cultures - BCID > E coli  - ? If source is urine as HPI mentions that her PCP may have noted she had a UTI  - UA in ED showed mild bacteria, did not show any WBC or nitrites - changed to Ancef today     Thrombocytopenia   -  likely a result of gram neg sepsis- platelets improving- follow  Severe weight loss - may be related to uncontrolled DM but I will obtain CT scans to r/o underlying malignancy when Cr improves  Time spent in minutes: 35 min DVT prophylaxis: SCDs Start: 01/30/20 [id number]  Code Status: full code Family Communication: I have tried reaching her daughter and the other contact listed on her face sheet but no one is picking up the call Disposition Plan:  Status is: Inpatient  Remains inpatient appropriate because:sepsis on IV Antibiotics   Dispo: The patient is from: Home              Anticipated d/c is to: Home              Anticipated d/c date is: 2 days              Patient currently is not medically stable to d/c.  Consultants:   none Procedures:   none Antimicrobials:  Anti-infectives (From admission, onward)   Start     Dose/Rate Route Frequency Ordered Stop   02/01/20 1400  ceFAZolin (ANCEF) IVPB 2g/100 mL premix        2 g 200 mL/hr over 30 Minutes Intravenous Every 8 hours 02/01/20 0924     01/31/20 1000  cefTRIAXone (ROCEPHIN) 1 g in sodium chloride 0.9 % 100 mL IVPB  Status:  Discontinued  1 g 200 mL/hr over 30 Minutes Intravenous Every 24 hours 01/30/20 1528 01/30/20 2336   01/31/20 0030  cefTRIAXone (ROCEPHIN) 2 g in sodium chloride 0.9 % 100 mL IVPB  Status:  Discontinued        2 g 200 mL/hr over 30 Minutes Intravenous Daily at bedtime 01/30/20 2336 02/01/20 0907   01/30/20 0215  cefTRIAXone (ROCEPHIN) 1 g in sodium chloride 0.9 % 100 mL IVPB        1 g 200 mL/hr over 30 Minutes Intravenous  Once 01/30/20 0209 01/30/20 0403       Objective: Vitals:   02/01/20 0146 02/01/20 0417 02/01/20 0919 02/01/20 1159  BP:  107/70 (!) 128/58 128/68  Pulse:  72 73 79  Resp:  Temp:  98.5 F (36.9 C) 98.4 F (36.9 C) 98.4 F (36.9 C)  TempSrc:  Oral Oral Oral  SpO2:  93% 96% 98%  Weight: 52.8 kg       Intake/Output Summary (Last 24 hours) at 02/01/2020  1401 Last data filed at 02/01/2020 1300 Gross per 24 hour  Intake 1830.43 ml  Output 2400 ml  Net -569.57 ml   Filed Weights   01/30/20 2316 01/31/20 0344 02/01/20 0146  Weight: 55.3 kg 55.3 kg 52.8 kg    Examination: General exam: Appears comfortable  HEENT: PERRLA, oral mucosa moist, no sclera icterus or thrush Respiratory system: Clear to auscultation. Respiratory effort normal. Cardiovascular system: S1 & S2 heard,  No murmurs  Gastrointestinal system: Abdomen soft, non-tender, nondistended. Normal bowel sounds   Central nervous system: Alert and oriented. No focal neurological deficits. Extremities: No cyanosis, clubbing or edema Skin: No rashes or ulcers Psychiatry:  Mood & affect appropriate.     Data Reviewed: I have personally reviewed following labs and imaging studies  CBC: Recent Labs  Lab 01/29/20 1740 01/30/20 0452 01/30/20 0945 02/01/20 0556  WBC 17.1*  --  16.2* 8.1  NEUTROABS  --   --  12.7*  --   HGB 14.8 16.7* 13.1 12.7  HCT 46.1* 49.0* 40.3 39.1  MCV 86.2  --  84.5 85.0  PLT 29*  --  25*   25* 53*   Basic Metabolic Panel: Recent Labs  Lab 01/30/20 0945 01/30/20 1704 01/31/20 0118 01/31/20 0619 02/01/20 0556  NA 142 143 141 140 139  K 3.0* 3.2* 3.5 4.3 3.3*  CL 104 108 107 106 109  CO2 GLUCOSE 206* 204* 169* 239* 154*  BUN 38* 35* 26* 24* 13  CREATININE 1.16* 0.94 0.76 0.78 0.56  CALCIUM 7.8* 8.0* 8.4* 8.0* 7.5*  MG 1.9  --   --  1.7  --    GFR: CrCl cannot be calculated (Unknown ideal weight.). Liver Function Tests: No results for input(s): AST, ALT, ALKPHOS, BILITOT, PROT, ALBUMIN in the last 168 hours. No results for input(s): LIPASE, AMYLASE in the last 168 hours. No results for input(s): AMMONIA in the last 168 hours. Coagulation Profile: Recent Labs  Lab 01/30/20 0945  INR 1.1   Cardiac Enzymes: No results for input(s): CKTOTAL, CKMB, CKMBINDEX, TROPONINI in the last 168 hours. BNP (last 3 results) No  results for input(s): PROBNP in the last 8760 hours. HbA1C: No results for input(s): HGBA1C in the last 72 hours. CBG: Recent Labs  Lab 01/31/20 1138 01/31/20 1751 01/31/20 2138 02/01/20 0637 02/01/20 1155  GLUCAP 213* 198* 286* 129* 243*   Lipid Profile: No results for input(s): CHOL, HDL, LDLCALC,  TRIG, CHOLHDL, LDLDIRECT in the last 72 hours. Thyroid Function Tests: Recent Labs    01/30/20 0945  TSH 0.012*   Anemia Panel: No results for input(s): VITAMINB12, FOLATE, FERRITIN, TIBC, IRON, RETICCTPCT in the last 72 hours. Urine analysis:    Component Value Date/Time   COLORURINE YELLOW 01/30/2020 0439   APPEARANCEUR CLEAR 01/30/2020 0439   LABSPEC 1.016 01/30/2020 0439   PHURINE 5.0 01/30/2020 0439   GLUCOSEU >=500 (A) 01/30/2020 0439   HGBUR MODERATE (A) 01/30/2020 0439   BILIRUBINUR NEGATIVE 01/30/2020 0439   KETONESUR 80 (A) 01/30/2020 0439   PROTEINUR NEGATIVE 01/30/2020 0439   NITRITE NEGATIVE 01/30/2020 0439   LEUKOCYTESUR TRACE (A) 01/30/2020 0439   Sepsis Labs: (procalcitonin:4,lacticidven:4) ) Recent Results (from the past 240 hour(s))  SARS Coronavirus 2 by RT PCR (hospital order, performed in [facility] hospital lab) Nasopharyngeal Nasopharyngeal Swab     Status: None   Collection Time: 01/29/20 10:55 AM   Specimen: Nasopharyngeal Swab  Result Value Ref Range Status   SARS Coronavirus 2 NEGATIVE NEGATIVE Final    Comment: (NOTE) SARS-CoV-2 target nucleic acids are NOT DETECTED.  The SARS-CoV-2 RNA is generally detectable in upper and lower respiratory specimens during the acute phase of infection. The lowest concentration of SARS-CoV-2 viral copies this assay can detect is 250 copies / mL. A negative result does not preclude SARS-CoV-2 infection and should not be used as the sole basis for treatment or other patient management decisions.  A negative result may occur with improper specimen collection / handling, submission of specimen  other than nasopharyngeal swab, presence of viral mutation(s) within the areas targeted by this assay, and inadequate number of viral copies (<250 copies / mL). A negative result must be combined with clinical observations, patient history, and epidemiological information.  Fact Sheet for Patients:   BoilerBrush.com.cy  Fact Sheet for Healthcare Providers: https://pope.com/  This test is not yet approved or  cleared by the [place] FDA and has been authorized for detection and/or diagnosis of SARS-CoV-2 by FDA under an Emergency Use Authorization (EUA).  This EUA will remain in effect (meaning this test can be used) for the duration of the COVID-19 declaration under Section 564(b)(1) of the Act, 21 U.S.C. section 360bbb-3(b)(1), unless the authorization is terminated or revoked sooner.  Performed at [facility], [address] [place], [place] [address]   SARS Coronavirus 2 by RT PCR (hospital order, performed in [facility] hospital lab) Nasopharyngeal Nasopharyngeal Swab     Status: None   Collection Time: 01/29/20 11:58 PM   Specimen: Nasopharyngeal Swab  Result Value Ref Range Status   SARS Coronavirus 2 NEGATIVE NEGATIVE Final    Comment: (NOTE) SARS-CoV-2 target nucleic acids are NOT DETECTED.  The SARS-CoV-2 RNA is generally detectable in upper and lower respiratory specimens during the acute phase of infection. The lowest concentration of SARS-CoV-2 viral copies this assay can detect is 250 copies / mL. A negative result does not preclude SARS-CoV-2 infection and should not be used as the sole basis for treatment or other patient management decisions.  A negative result may occur with improper specimen collection / handling, submission of specimen other than nasopharyngeal swab, presence of viral mutation(s) within the areas targeted by this assay, and inadequate number of viral copies (<250 copies / mL). A  negative result must be combined with clinical observations, patient history, and epidemiological information.  Fact Sheet for Patients:   BoilerBrush.com.cy  Fact Sheet for Healthcare Providers: https://pope.com/  This test is not yet approved or  cleared by the [place] and has been authorized for detection and/or diagnosis of SARS-CoV-2 by FDA under an Emergency Use Authorization (EUA).  This EUA will remain in effect (meaning this test can be used) for the duration of the COVID-19 declaration under Section 564(b)(1) of the Act, 21 U.S.C. section 360bbb-3(b)(1), unless the authorization is terminated or revoked sooner.  Performed at [facility], [address] [place], [place] [address]   Blood culture (routine x 2)     Status: Abnormal (Preliminary result)   Collection Time: 01/30/20  3:20 AM   Specimen: BLOOD LEFT FOREARM  Result Value Ref Range Status   Specimen Description BLOOD LEFT FOREARM  Final   Special Requests   Final    BOTTLES DRAWN AEROBIC AND ANAEROBIC Blood Culture adequate volume   Culture  Setup Time   Final    GRAM NEGATIVE RODS Organism ID to follow CRITICAL RESULT CALLED TO, READ BACK BY AND VERIFIED WITH: K AMEND PHARMD 01/30/20 2329 JDW IN BOTH AEROBIC AND ANAEROBIC BOTTLES    Culture ESCHERICHIA COLI (A)  Final   Report Status PENDING  Incomplete   Organism ID, Bacteria ESCHERICHIA COLI  Final      Susceptibility   Escherichia coli - MIC*    AMPICILLIN >=32 RESISTANT Resistant     CEFAZOLIN 8 SENSITIVE Sensitive     CEFEPIME <=0.12 SENSITIVE Sensitive     CEFTAZIDIME <=1 SENSITIVE Sensitive     CEFTRIAXONE <=0.25 SENSITIVE Sensitive     CIPROFLOXACIN 1 SENSITIVE Sensitive     GENTAMICIN >=16 RESISTANT Resistant     IMIPENEM <=0.25 SENSITIVE Sensitive     TRIMETH/SULFA <=20 SENSITIVE Sensitive     AMPICILLIN/SULBACTAM >=32 RESISTANT Resistant     PIP/TAZO Value in next row Sensitive       <=4 SENSITIVEPerformed at [facility], [address] [place], [place] [address]    * ESCHERICHIA COLI  Blood Culture ID Panel (Reflexed)     Status: Abnormal   Collection Time: 01/30/20  3:20 AM  Result Value Ref Range Status   Enterococcus faecalis NOT DETECTED NOT DETECTED Final   Enterococcus Faecium NOT DETECTED NOT DETECTED Final   Listeria monocytogenes NOT DETECTED NOT DETECTED Final   Staphylococcus species NOT DETECTED NOT DETECTED Final   Staphylococcus aureus (BCID) NOT DETECTED NOT DETECTED Final   Staphylococcus epidermidis NOT DETECTED NOT DETECTED Final   Staphylococcus lugdunensis NOT DETECTED NOT DETECTED Final   Streptococcus species NOT DETECTED NOT DETECTED Final   Streptococcus agalactiae NOT DETECTED NOT DETECTED Final   Streptococcus pneumoniae NOT DETECTED NOT DETECTED Final   Streptococcus pyogenes NOT DETECTED NOT DETECTED Final   A.calcoaceticus-baumannii NOT DETECTED NOT DETECTED Final   Bacteroides fragilis NOT DETECTED NOT DETECTED Final   Enterobacterales DETECTED (A) NOT DETECTED Final    Comment: Enterobacterales represent a large order of gram negative bacteria, not a single organism. CRITICAL RESULT CALLED TO, READ BACK BY AND VERIFIED WITH: K AMEND PHARMD 01/30/20 2329 JDW    Enterobacter cloacae complex NOT DETECTED NOT DETECTED Final   Escherichia coli DETECTED (A) NOT DETECTED Final    Comment: CRITICAL RESULT CALLED TO, READ BACK BY AND VERIFIED WITH: K AMEND PHARMD 01/30/20 2329 JDW    Klebsiella aerogenes NOT DETECTED NOT DETECTED Final   Klebsiella oxytoca NOT DETECTED NOT DETECTED Final   Klebsiella pneumoniae NOT DETECTED NOT DETECTED Final   Proteus species NOT DETECTED NOT DETECTED  Final   Salmonella species NOT DETECTED NOT DETECTED Final   Serratia marcescens NOT DETECTED NOT DETECTED Final   Haemophilus influenzae NOT DETECTED NOT DETECTED Final   Neisseria meningitidis NOT DETECTED NOT DETECTED Final   Pseudomonas  aeruginosa NOT DETECTED NOT DETECTED Final   Stenotrophomonas maltophilia NOT DETECTED NOT DETECTED Final   Candida albicans NOT DETECTED NOT DETECTED Final   Candida auris NOT DETECTED NOT DETECTED Final   Candida glabrata NOT DETECTED NOT DETECTED Final   Candida krusei NOT DETECTED NOT DETECTED Final   Candida parapsilosis NOT DETECTED NOT DETECTED Final   Candida tropicalis NOT DETECTED NOT DETECTED Final   Cryptococcus neoformans/gattii NOT DETECTED NOT DETECTED Final   CTX-M ESBL NOT DETECTED NOT DETECTED Final   Carbapenem resistance IMP NOT DETECTED NOT DETECTED Final   Carbapenem resistance KPC NOT DETECTED NOT DETECTED Final   Carbapenem resistance NDM NOT DETECTED NOT DETECTED Final   Carbapenem resist OXA 48 LIKE NOT DETECTED NOT DETECTED Final   Carbapenem resistance VIM NOT DETECTED NOT DETECTED Final    Comment: Performed at [facility], [address], [place], [place] [address]  Blood culture (routine x 2)     Status: None (Preliminary result)   Collection Time: 01/30/20  9:50 AM   Specimen: BLOOD LEFT WRIST  Result Value Ref Range Status   Specimen Description BLOOD LEFT WRIST  Final   Special Requests   Final    BOTTLES DRAWN AEROBIC AND ANAEROBIC Blood Culture results may not be optimal due to an inadequate volume of blood received in culture bottles   Culture   Final    NO GROWTH 2 DAYS Performed at [facility], [address], [place], [place] [address]    Report Status PENDING  Incomplete  Culture, blood (single)     Status: None (Preliminary result)   Collection Time: 01/31/20  1:17 AM   Specimen: BLOOD  Result Value Ref Range Status   Specimen Description BLOOD LEFT ANTECUBITAL  Final   Special Requests   Final    BOTTLES DRAWN AEROBIC ONLY Blood Culture adequate volume   Culture   Final    NO GROWTH 1 DAY Performed at [facility], [address] [place], [place] [address]    Report Status PENDING  Incomplete          Radiology Studies: ECHOCARDIOGRAM COMPLETE  Result Date: 01/31/2020    ECHOCARDIOGRAM REPORT   Patient Name:   LACHERYL NIESEN Battey Date of Exam: 01/31/2020 Medical Rec #:  [id number]   Height:       62.0 in Accession #:    [id number]  Weight:       122.0 lb Date of Birth:  March 01, 1951    BSA:          1.549 m Patient Age:    69 years    BP:           139/68 mmHg Patient Gender: F           HR:           71 bpm. Exam Location:  Inpatient Procedure: 2D Echo Indications:    congestive heart failure 428.0  History:        Patient has no prior history of Echocardiogram examinations.                 Arrythmias:Atrial Fibrillation; Risk Factors:Dyslipidemia.  Sonographer:    Delcie Roch Referring Phys: [id number] RONDELL A SMITH IMPRESSIONS  1. Left ventricular ejection fraction, by estimation, is 50 to 55%. The left ventricle has low normal function. The left ventricle has no regional wall motion abnormalities. There is mild left ventricular hypertrophy. Left ventricular diastolic parameters are indeterminate.  2. Right ventricular systolic function is mildly reduced. The right ventricular size is normal. Tricuspid regurgitation signal is inadequate for assessing PA pressure.  3. The mitral valve is normal in structure. Trivial mitral valve regurgitation.  4. The aortic valve is tricuspid. Aortic valve regurgitation is mild to moderate. No aortic stenosis is present.  5. The inferior vena cava is normal in size with greater than 50% respiratory variability, suggesting right atrial pressure of 3 mmHg. FINDINGS  Left Ventricle: Left ventricular ejection fraction, by estimation, is 50 to 55%. The left ventricle has low normal function. The left ventricle has no regional wall motion abnormalities. The left ventricular internal cavity size was normal in size. There is mild left ventricular hypertrophy. Left ventricular diastolic parameters are indeterminate. Right Ventricle: The right ventricular size is normal. Right  vetricular wall thickness was not assessed. Right ventricular systolic function is mildly reduced. Tricuspid regurgitation signal is inadequate for assessing PA pressure. Left Atrium: Left atrial size was normal in size. Right Atrium: Right atrial size was normal in size. Pericardium: Trivial pericardial effusion is present. Mitral Valve: The mitral valve is normal in structure. Trivial mitral valve regurgitation. Tricuspid Valve: The tricuspid valve is normal in structure. Tricuspid valve regurgitation is trivial. Aortic Valve: The aortic valve is tricuspid. Aortic valve regurgitation is mild to moderate. Aortic regurgitation PHT measures 494 msec. No aortic stenosis is present. Pulmonic Valve: The pulmonic valve was not well visualized. Pulmonic valve regurgitation is not visualized. Aorta: The aortic root and ascending aorta are structurally normal, with no evidence of dilitation. Venous: The inferior vena cava is normal in size with greater than 50% respiratory variability, suggesting right atrial pressure of 3 mmHg. IAS/Shunts: The interatrial septum was not well visualized.  LEFT VENTRICLE PLAX 2D LVIDd:         4.50 cm  Diastology LVIDs:         3.20 cm  LV e' lateral:   5.87 cm/s LV PW:         0.90 cm  LV E/e' lateral: 10.2 LV IVS:        1.00 cm  LV e' medial:    5.33 cm/s LVOT diam:     2.00 cm  LV E/e' medial:  11.3 LV SV:         53 LV SV Index:   34 LVOT Area:     3.14 cm  RIGHT VENTRICLE             IVC RV S prime:     10.20 cm/s  IVC diam: 1.00 cm TAPSE (M-mode): 1.4 cm LEFT ATRIUM             Index LA diam:        4.20 cm 2.71 cm/m LA Vol (A2C):   45.8 ml 29.56 ml/m LA Vol (A4C):   30.0 ml 19.36 ml/m LA Biplane Vol: 36.8 ml 23.75 ml/m  AORTIC VALVE LVOT Vmax:   81.60 cm/s LVOT Vmean:  55.000 cm/s LVOT VTI:    0.170 m AI PHT:      494 msec  AORTA Ao Root diam: 3.00 cm Ao Asc diam:  3.40 cm MITRAL VALVE MV Area (PHT): 2.62 cm    SHUNTS MV Decel Time: 289 msec  Systemic VTI:  0.17 m MV E  velocity: 60.00 cm/s  Systemic Diam: 2.00 cm MV A velocity: 83.60 cm/s MV E/A ratio:  0.72 Epifanio Lesches MD Electronically signed by Epifanio Lesches MD Signature Date/Time: 01/31/2020/6:05:27 PM    Final       Scheduled Meds:  insulin aspart  0-5 Units Subcutaneous QHS   insulin aspart  3 Units Subcutaneous TID WC   insulin glargine  18 Units Subcutaneous QHS   Continuous Infusions:   ceFAZolin (ANCEF) IV       LOS: 2 days      Calvert Cantor, MD [facility] Pager: [email or web address] 02/01/2020, 2:01 PM

## 2020-02-01 NOTE — Evaluation (Signed)
Physical Therapy Evaluation Patient Details Name: Jillian Travis MRN: [id number] DOB: 06-18-1950 Today's Date: 02/01/2020   History of Present Illness  Patient is a 69 y/o female who presents with weakness, s/p fall from walking dog a few days PTA. Found to have leukocytosis, DKA, sepsis and A-fib with RVR. Pt with new diagosis of DM. PMH includes HLD.  Clinical Impression  Patient presents with generalized weakness, impaired balance, pain and impaired mobility s/p above. Pt was independent for ADLs/IADLs and ambulation PTA. Today, pt requires close Min guard-supervision for balance/safety with mobility. Reports pain in stomach and LEs. Encouraged increasing activity while in the hospital. Utilized interpreter services during session. Will follow acutely to maximize independence and mobility prior to return home. Will likely not need follow up pending progression.     Follow Up Recommendations No PT follow up;Supervision for mobility/OOB (pending progress)    Equipment Recommendations  None recommended by PT    Recommendations for Other Services       Precautions / Restrictions Precautions Precautions: Fall Precaution Comments: very HOH Restrictions Weight Bearing Restrictions: No      Mobility  Bed Mobility Overal bed mobility: Needs Assistance Bed Mobility: Supine to Sit;Sit to Supine     Supine to sit: Supervision;HOB elevated Sit to supine: Supervision;HOB elevated   General bed mobility comments: No assist needed.  Transfers Overall transfer level: Needs assistance Equipment used: None Transfers: Sit to/from Stand Sit to Stand: Min guard         General transfer comment: Min guard for safety. Stood from EOB x2, no assist needed.  Ambulation/Gait Ambulation/Gait assistance: Min guard Gait Distance (Feet): 34 Feet Assistive device: None Gait Pattern/deviations: Step-through pattern;Decreased stride length Gait velocity: decreased Gait velocity interpretation:  <1.31 ft/sec, indicative of household ambulator General Gait Details: Slow, mostly steady gait, close Min guard for safety. Reports feeling weak.  Stairs            Wheelchair Mobility    Modified Rankin (Stroke Patients Only)       Balance Overall balance assessment: Mild deficits observed, not formally tested                                           Pertinent Vitals/Pain Pain Assessment: Faces Faces Pain Scale: Hurts little more Pain Location: stomach and LEs Pain Descriptors / Indicators: Sore Pain Intervention(s): Monitored during session;Repositioned;Limited activity within patient's tolerance    Home Living Family/patient expects to be discharged to:: Private residence Living Arrangements: Spouse/significant other Available Help at Discharge: Family Type of Home: House Home Access: Stairs to enter   Secretary/administrator of Steps: 3-4 Home Layout: One level Home Equipment: None      Prior Function Level of Independence: Independent         Comments: Drives, cooks, cleans.     Hand Dominance        Extremity/Trunk Assessment   Upper Extremity Assessment Upper Extremity Assessment: Defer to OT evaluation    Lower Extremity Assessment Lower Extremity Assessment: Generalized weakness (but functional)       Communication   Communication: Interpreter utilized  Cognition Arousal/Alertness: Awake/alert Behavior During Therapy: WFL for tasks assessed/performed Overall Cognitive Status: Within Functional Limits for tasks assessed  General Comments General comments (skin integrity, edema, etc.): Spouse present during session. Utilized interpreter ipad.    Exercises     Assessment/Plan    PT Assessment Patient needs continued PT services  PT Problem List Decreased strength;Decreased mobility;Pain;Decreased balance;Decreased activity tolerance       PT Treatment  Interventions Therapeutic activities;Gait training;Therapeutic exercise;Patient/family education;Balance training;Stair training;Functional mobility training;DME instruction    PT Goals (Current goals can be found in the Care Plan section)  Acute Rehab PT Goals Patient Stated Goal: feel better, less pain PT Goal Formulation: With patient Time For Goal Achievement: 02/15/20 Potential to Achieve Goals: Good    Frequency Min 3X/week   Barriers to discharge        Co-evaluation               AM-PAC PT "6 Clicks" Mobility  Outcome Measure Help needed turning from your back to your side while in a flat bed without using bedrails?: None Help needed moving from lying on your back to sitting on the side of a flat bed without using bedrails?: None Help needed moving to and from a bed to a chair (including a wheelchair)?: A Little Help needed standing up from a chair using your arms (e.g., wheelchair or bedside chair)?: A Little Help needed to walk in hospital room?: A Little Help needed climbing 3-5 steps with a railing? : A Little 6 Click Score: 20    End of Session Equipment Utilized During Treatment: Gait belt Activity Tolerance: Patient tolerated treatment well Patient left: in bed;with call bell/phone within reach;with family/visitor present Nurse Communication: Mobility status PT Visit Diagnosis: Pain Pain - part of body:  (abdomen)    Time: 3888-2800 PT Time Calculation (min) (ACUTE ONLY): 23 min   Charges:   PT Evaluation $PT Eval Moderate Complexity: 1 Mod PT Treatments $Therapeutic Activity: 8-22 mins        Vale Haven, PT, DPT Acute Rehabilitation Services Pager [phone] Office [phone]      Blake Divine A Lanier Ensign 02/01/2020, 3:24 PM

## 2020-02-01 NOTE — Progress Notes (Signed)
Pharmacy Antibiotic Note  Jillian Travis is a 69 y.o. female admitted on 01/29/2020 with E coli bacteremia and sepsis.  Pharmacy has been consulted for cefazolin dosing.  Patient was in acute renal failure on admission but renal function has improved to baseline (Scr 0.56). Patient is afebrile, WBC WNL.   Plan: Cefazolin 2g IV every 8 hours  Monitor renal function, clinical status, and LOT  Weight: 52.8 kg (116 lb 8 oz)  Temp (24hrs), Avg:98.3 F (36.8 C), Min:98 F (36.7 C), Max:98.5 F (36.9 C)  Recent Labs  Lab 01/29/20 1740 01/30/20 0337 01/30/20 0338 01/30/20 0945 01/30/20 1704 01/31/20 0118 01/31/20 0619 02/01/20 0556  WBC 17.1*  --   --  16.2*  --   --   --  8.1  CREATININE 1.86*  --    < > 1.16* 0.94 0.76 0.78 0.56  LATICACIDVEN  --  2.6*  --  1.8  --   --   --   --    < > = values in this interval not displayed.    CrCl cannot be calculated (Unknown ideal weight.).  Est CrCl 78 mL/min  Allergies  Allergen Reactions  . Other Other (See Comments)    Canine dander allergy- Per interpreter, the patient stated she experienced "dizziness and arm pain" after exposure to a dog and his collar (could have been the collar, if it was medicated??)    Antimicrobials this admission: CTX 9/3 >> (increased to 2g q24h)>>9/5 Cefazolin 9/5 >>   Microbiology results: 9/3 Bcx >> 2/2 bottles growing e coli - R amp, Unasyn, gent  Thank you for allowing pharmacy to be a part of this patient's care.  Kinnie Feil, PharmD PGY1 Acute Care Pharmacy Resident Phone: [phone] 02/01/2020 9:19 AM  Please check [email or web address] for unit specific pharmacy phone numbers.

## 2020-02-01 NOTE — Progress Notes (Signed)
Patient educated again via interpretor to call staff before getting out of bed. Patient verbalized understanding of education. Bed in lowest position. Call bell within reach. Will continue to monitor patient.

## 2020-02-02 DIAGNOSIS — A4151 Sepsis due to Escherichia coli [E. coli]: Secondary | ICD-10-CM

## 2020-02-02 DIAGNOSIS — R634 Abnormal weight loss: Secondary | ICD-10-CM

## 2020-02-02 LAB — CULTURE, BLOOD (ROUTINE X 2): Special Requests: ADEQUATE

## 2020-02-02 LAB — GLUCOSE, CAPILLARY
Glucose-Capillary: 88 mg/dL (ref 70–99)
Glucose-Capillary: 218 mg/dL — ABNORMAL HIGH (ref 70–99)
Glucose-Capillary: 157 mg/dL — ABNORMAL HIGH (ref 70–99)

## 2020-02-02 LAB — BASIC METABOLIC PANEL
Anion gap: 5 (ref 5–15)
BUN: 7 mg/dL — ABNORMAL LOW (ref 8–23)
CO2: 22 mmol/L (ref 22–32)
Calcium: 7.6 mg/dL — ABNORMAL LOW (ref 8.9–10.3)
Chloride: 106 mmol/L (ref 98–111)
Creatinine, Ser: 0.7 mg/dL (ref 0.44–1.00)
GFR calc Af Amer: >60 mL/min (ref >60)
GFR calc non Af Amer: >60 mL/min (ref >60)
Glucose, Bld: 128 mg/dL — ABNORMAL HIGH (ref 70–99)
Potassium: 3.4 mmol/L — ABNORMAL LOW (ref 3.5–5.1)
Sodium: 133 mmol/L — ABNORMAL LOW (ref 135–145)

## 2020-02-02 LAB — CBC
HCT: 36.4 % (ref 36.0–46.0)
Hemoglobin: 12.1 g/dL (ref 12.0–15.0)
MCH: 27.9 pg (ref 26.0–34.0)
MCHC: 33.2 g/dL (ref 30.0–36.0)
MCV: 83.9 fL (ref 80.0–100.0)
Platelets: 128 10*3/uL — ABNORMAL LOW (ref 150–400)
RBC: 4.34 MIL/uL (ref 3.87–5.11)
RDW: 12.8 % (ref 11.5–15.5)
WBC: 9.9 10*3/uL (ref 4.0–10.5)
nRBC: 0 % (ref 0.0–0.2)

## 2020-02-02 LAB — MAGNESIUM: Magnesium: 1.4 mg/dL — ABNORMAL LOW (ref 1.7–2.4)

## 2020-02-02 MED ORDER — CEPHALEXIN 500 MG PO CAPS: 500.0000 mg | ORAL_CAPSULE | Freq: Four times a day (QID) | ORAL | 0 refills | Status: AC

## 2020-02-02 MED ORDER — BLOOD GLUCOSE MONITOR KIT: PACK | 0 refills | Status: DC

## 2020-02-02 MED ORDER — INSULIN GLARGINE 100 UNIT/ML ~~LOC~~ SOLN
15.0000 [IU] | Freq: Every day | SUBCUTANEOUS | 11 refills | Status: DC
Start: 2020-02-02 — End: 2022-07-20

## 2020-02-02 MED ORDER — INSULIN ASPART 100 UNIT/ML ~~LOC~~ SOLN: 0.0000 [IU] | Freq: Every day | SUBCUTANEOUS | 11 refills | Status: DC

## 2020-02-02 MED ORDER — POTASSIUM CHLORIDE CRYS ER 20 MEQ PO TBCR
40.0000 meq | EXTENDED_RELEASE_TABLET | ORAL | Status: AC
  Administered 2020-02-02 (×2): 40 meq via ORAL
  Filled 2020-02-02 (×2): qty 2

## 2020-02-02 MED ORDER — MAGNESIUM SULFATE 4 GM/100ML IV SOLN
4.0000 g | Freq: Once | INTRAVENOUS | Status: AC
  Administered 2020-02-02: 4 g via INTRAVENOUS
  Filled 2020-02-02: qty 100

## 2020-02-02 MED ORDER — INSULIN ASPART 100 UNIT/ML ~~LOC~~ SOLN
5.0000 [IU] | Freq: Three times a day (TID) | SUBCUTANEOUS | 11 refills | Status: DC
Start: 2020-02-02 — End: 2022-07-18

## 2020-02-02 NOTE — Discharge Summary (Signed)
Physician Discharge Summary  Jillian Travis AOZ:[id number] DOB: January 05, 1951 DOA: 01/29/2020  PCP: Leonard Downing, MD  Admit date: 01/29/2020 Discharge date: 02/02/2020  Admitted From: home  Disposition:  home   Recommendations for Outpatient Follow-up:  1. Please follow up on her weight loss  Home Health:  none  Discharge Condition:  stable   CODE STATUS:  Full code   Diet recommendation:  Heart healthy and carb modified Consultations:  none  Procedures/Studies: . none   Discharge Diagnoses:  Principal Problem:   DKA (diabetic ketoacidoses) ([place]) Active Problems:   Sepsis due to Escherichia coli (E. coli) (HCC)   Acute renal failure (ARF) (HCC)   Generalized weakness   Thrombocytopenia (HCC)     Brief Summary: Jillian Travis a 69 y.o.femalewith medical history significant ofprediabetes and hyperlipidemia presents withcomplaints of weakness. History is obtained from patient with use of interpreter services and review of records. There were reports that the patient had fallen over a dog at home approximately 5 days ago and had seen her primary care provider the following day. Lab work was obtained which revealed signs of a leukocytosis with hyperglycemia and concern for urinary tract infection. Patient reports that since that time she has been very sleepy and weak and was not even able to walk without assistance. Noted associated symptoms of some abdominal pain. Patient husband notes that she is become very small and skinny over the last few years, but had never been previously diagnosed with diabetes. In the ED she was found to have DKA.  CBG>600 pH 7.274 B- hydroxybutyric acid > 8 Lactic acid 2.6  Hospital Course:  Principal Problem:   DKA (diabetic ketoacidoses) - new diagnosis of diabetes - treated with insulin infusion and IVF - A1c 16.7 - She has been started on Lantus and mealtime Novolog - obtain consult with dietician and diabetes coordinator - I have spoken  with her husband and outlined the treatments she will need for her DM- they have received education regarding insulin and diet  Active Problems:   Acute renal failure - prerenal - Cr 1.98 >> 0.78 >> 0.56- stopped IVF yesterday- she is maintaining oral hydration  Hypokalemia, hypermagnesemia - replaced-   E coli bacteremia, sepsis - gram neg rods in 1 set of cultures - BCID > E coli  - ? If source is urine as HPI mentions that her PCP may have noted she had a UTI  - However,  UA in ED showed mild bacteria, did not show any WBC or nitrites - changed to Keflex today based on sensitivities     Thrombocytopenia   - likely a result of gram neg sepsis- platelets improving-   Severe weight loss - may be related to uncontrolled DM - should be followed as outpatient   Discharge Exam: Vitals:   02/02/20 0454 02/02/20 1151  BP: 133/74 119/62  Pulse: 77 77  Resp: 19 18  Temp: 98.1 F (36.7 C) 98.9 F (37.2 C)  SpO2: 96% 97%   Vitals:   02/02/20 0018 02/02/20 0304 02/02/20 0454 02/02/20 1151  BP:   133/74 119/62  Pulse:   77 77  Resp:   19 18  Temp:   98.1 F (36.7 C) 98.9 F (37.2 C)  TempSrc:   Oral Oral  SpO2:   96% 97%  Weight: 55.1 kg     Height:  '5\' 5"'  (1.651 m)      General: Pt is alert, awake, not in acute distress Cardiovascular: RRR, S1/S2 +,  no rubs, no gallops Respiratory: CTA bilaterally, no wheezing, no rhonchi Abdominal: Soft, NT, ND, bowel sounds + Extremities: no edema, no cyanosis   Discharge Instructions  Discharge Instructions    Amb Referral to Nutrition and Diabetic E   Complete by: As directed      Allergies as of 02/02/2020      Reactions   Other Other (See Comments)   Canine dander allergy- Per interpreter, the patient stated she experienced "dizziness and arm pain" after exposure to a dog and his collar (could have been the collar, if it was medicated??)      Medication List    STOP taking these medications    HYDROcodone-acetaminophen 5-325 MG tablet Commonly known as: NORCO/VICODIN     TAKE these medications   blood glucose meter kit and supplies Kit Dispense based on patient and insurance preference. Use up to four times daily as directed. (FOR ICD-9 250.00, 250.01).   cephALEXin 500 MG capsule Commonly known as: KEFLEX Take 1 capsule (500 mg total) by mouth 4 (four) times daily for 10 days.   insulin aspart 100 UNIT/ML injection Commonly known as: novoLOG Inject 5 Units into the skin 3 (three) times daily with meals.   insulin aspart 100 UNIT/ML injection Commonly known as: novoLOG Inject 0-5 Units into the skin at bedtime. CBG 70 - 120: 0 units  CBG 121 - 150: 0 units  CBG 151 - 200: 0 units  CBG 201 - 250: 2 units  CBG 251 - 300: 3 units  CBG 301 - 350: 4 units  CBG 351 - 400: 5 units   insulin glargine 100 UNIT/ML injection Commonly known as: LANTUS Inject 0.15 mLs (15 Units total) into the skin at bedtime.       Follow-up Information    Leonard Downing, MD. Schedule an appointment as soon as possible for a visit in 1 week(s).   Specialty: Family Medicine Contact information: [place] [address] [phone]              Allergies  Allergen Reactions  . Other Other (See Comments)    Canine dander allergy- Per interpreter, the patient stated she experienced "dizziness and arm pain" after exposure to a dog and his collar (could have been the collar, if it was medicated??)      DG Chest 2 View  Result Date: 01/29/2020 CLINICAL DATA:  69 year old female with history of weakness. EXAM: CHEST - 2 VIEW COMPARISON:  Chest x-ray 06/30/2016. FINDINGS: Lung volumes are normal. No consolidative airspace disease. No pleural effusions. No pneumothorax. No pulmonary nodule or mass noted. Pulmonary vasculature and the cardiomediastinal silhouette are within normal limits. Atherosclerosis in the thoracic aorta. IMPRESSION: 1.  No radiographic evidence of  acute cardiopulmonary disease. 2. Aortic atherosclerosis. Electronically Signed   By: Vinnie Langton M.D.   On: 01/29/2020 12:03   CT Head Wo Contrast  Result Date: 01/29/2020 CLINICAL DATA:  Head trauma. EXAM: CT HEAD WITHOUT CONTRAST TECHNIQUE: Contiguous axial images were obtained from the base of the skull through the vertex without intravenous contrast. COMPARISON:  None. FINDINGS: Brain: No evidence of acute infarction, hemorrhage, hydrocephalus, extra-axial collection or mass lesion/mass effect. Vascular: No hyperdense vessel or unexpected calcification. Skull: Normal. Negative for fracture or focal lesion. Sinuses/Orbits: Globes and orbits are unremarkable. The visualized sinuses and mastoid air cells are clear. Other: None. IMPRESSION: Negative unenhanced CT scan of the brain. Electronically Signed   By: Lajean Manes M.D.   On: 01/29/2020  19:03   ECHOCARDIOGRAM COMPLETE  Result Date: 01/31/2020    ECHOCARDIOGRAM REPORT   Patient Name:   AMARISS DETAMORE Stankiewicz Date of Exam: 01/31/2020 Medical Rec #:  [id number]   Height:       62.0 in Accession #:    [id number]  Weight:       122.0 lb Date of Birth:  10/12/1950    BSA:          1.549 m Patient Age:    89 years    BP:           139/68 mmHg Patient Gender: F           HR:           71 bpm. Exam Location:  Inpatient Procedure: 2D Echo Indications:    congestive heart failure 428.0  History:        Patient has no prior history of Echocardiogram examinations.                 Arrythmias:Atrial Fibrillation; Risk Factors:Dyslipidemia.  Sonographer:    Johny Chess Referring Phys: [id number] RONDELL A SMITH IMPRESSIONS  1. Left ventricular ejection fraction, by estimation, is 50 to 55%. The left ventricle has low normal function. The left ventricle has no regional wall motion abnormalities. There is mild left ventricular hypertrophy. Left ventricular diastolic parameters are indeterminate.  2. Right ventricular systolic function is mildly reduced. The right ventricular  size is normal. Tricuspid regurgitation signal is inadequate for assessing PA pressure.  3. The mitral valve is normal in structure. Trivial mitral valve regurgitation.  4. The aortic valve is tricuspid. Aortic valve regurgitation is mild to moderate. No aortic stenosis is present.  5. The inferior vena cava is normal in size with greater than 50% respiratory variability, suggesting right atrial pressure of 3 mmHg. FINDINGS  Left Ventricle: Left ventricular ejection fraction, by estimation, is 50 to 55%. The left ventricle has low normal function. The left ventricle has no regional wall motion abnormalities. The left ventricular internal cavity size was normal in size. There is mild left ventricular hypertrophy. Left ventricular diastolic parameters are indeterminate. Right Ventricle: The right ventricular size is normal. Right vetricular wall thickness was not assessed. Right ventricular systolic function is mildly reduced. Tricuspid regurgitation signal is inadequate for assessing PA pressure. Left Atrium: Left atrial size was normal in size. Right Atrium: Right atrial size was normal in size. Pericardium: Trivial pericardial effusion is present. Mitral Valve: The mitral valve is normal in structure. Trivial mitral valve regurgitation. Tricuspid Valve: The tricuspid valve is normal in structure. Tricuspid valve regurgitation is trivial. Aortic Valve: The aortic valve is tricuspid. Aortic valve regurgitation is mild to moderate. Aortic regurgitation PHT measures 494 msec. No aortic stenosis is present. Pulmonic Valve: The pulmonic valve was not well visualized. Pulmonic valve regurgitation is not visualized. Aorta: The aortic root and ascending aorta are structurally normal, with no evidence of dilitation. Venous: The inferior vena cava is normal in size with greater than 50% respiratory variability, suggesting right atrial pressure of 3 mmHg. IAS/Shunts: The interatrial septum was not well visualized.  LEFT  VENTRICLE PLAX 2D LVIDd:         4.50 cm  Diastology LVIDs:         3.20 cm  LV e' lateral:   5.87 cm/s LV PW:         0.90 cm  LV E/e' lateral: 10.2 LV IVS:        1.00 cm  LV e' medial:    5.33 cm/s LVOT diam:     2.00 cm  LV E/e' medial:  11.3 LV SV:         53 LV SV Index:   34 LVOT Area:     3.14 cm  RIGHT VENTRICLE             IVC RV S prime:     10.20 cm/s  IVC diam: 1.00 cm TAPSE (M-mode): 1.4 cm LEFT ATRIUM             Index LA diam:        4.20 cm 2.71 cm/m LA Vol (A2C):   45.8 ml 29.56 ml/m LA Vol (A4C):   30.0 ml 19.36 ml/m LA Biplane Vol: 36.8 ml 23.75 ml/m  AORTIC VALVE LVOT Vmax:   81.60 cm/s LVOT Vmean:  55.000 cm/s LVOT VTI:    0.170 m AI PHT:      494 msec  AORTA Ao Root diam: 3.00 cm Ao Asc diam:  3.40 cm MITRAL VALVE MV Area (PHT): 2.62 cm    SHUNTS MV Decel Time: 289 msec    Systemic VTI:  0.17 m MV E velocity: 60.00 cm/s  Systemic Diam: 2.00 cm MV A velocity: 83.60 cm/s MV E/A ratio:  0.72 Oswaldo Milian MD Electronically signed by Oswaldo Milian MD Signature Date/Time: 01/31/2020/6:05:27 PM    Final      The results of significant diagnostics from this hospitalization (including imaging, microbiology, ancillary and laboratory) are listed below for reference.     Microbiology: Recent Results (from the past 240 hour(s))  SARS Coronavirus 2 by RT PCR (hospital order, performed in [facility] hospital lab) Nasopharyngeal Nasopharyngeal Swab     Status: None   Collection Time: 01/29/20 10:55 AM   Specimen: Nasopharyngeal Swab  Result Value Ref Range Status   SARS Coronavirus 2 NEGATIVE NEGATIVE Final    Comment: (NOTE) SARS-CoV-2 target nucleic acids are NOT DETECTED.  The SARS-CoV-2 RNA is generally detectable in upper and lower respiratory specimens during the acute phase of infection. The lowest concentration of SARS-CoV-2 viral copies this assay can detect is 250 copies / mL. A negative result does not preclude SARS-CoV-2 infection and should not be used as  the sole basis for treatment or other patient management decisions.  A negative result may occur with improper specimen collection / handling, submission of specimen other than nasopharyngeal swab, presence of viral mutation(s) within the areas targeted by this assay, and inadequate number of viral copies (<250 copies / mL). A negative result must be combined with clinical observations, patient history, and epidemiological information.  Fact Sheet for Patients:   StrictlyIdeas.no  Fact Sheet for Healthcare Providers: BankingDealers.co.za  This test is not yet approved or  cleared by the [place] FDA and has been authorized for detection and/or diagnosis of SARS-CoV-2 by FDA under an Emergency Use Authorization (EUA).  This EUA will remain in effect (meaning this test can be used) for the duration of the COVID-19 declaration under Section 564(b)(1) of the Act, 21 U.S.C. section 360bbb-3(b)(1), unless the authorization is terminated or revoked sooner.  Performed at [facility], [place] [address] [place], [place] [address]   SARS Coronavirus 2 by RT PCR (hospital order, performed in [facility] hospital lab) Nasopharyngeal Nasopharyngeal Swab     Status: None   Collection Time: 01/29/20 11:58 PM   Specimen: Nasopharyngeal Swab  Result Value Ref Range Status   SARS Coronavirus 2 NEGATIVE NEGATIVE  Final    Comment: (NOTE) SARS-CoV-2 target nucleic acids are NOT DETECTED.  The SARS-CoV-2 RNA is generally detectable in upper and lower respiratory specimens during the acute phase of infection. The lowest concentration of SARS-CoV-2 viral copies this assay can detect is 250 copies / mL. A negative result does not preclude SARS-CoV-2 infection and should not be used as the sole basis for treatment or other patient management decisions.  A negative result may occur with improper specimen collection / handling, submission of specimen  other than nasopharyngeal swab, presence of viral mutation(s) within the areas targeted by this assay, and inadequate number of viral copies (<250 copies / mL). A negative result must be combined with clinical observations, patient history, and epidemiological information.  Fact Sheet for Patients:   StrictlyIdeas.no  Fact Sheet for Healthcare Providers: BankingDealers.co.za  This test is not yet approved or  cleared by the [place] FDA and has been authorized for detection and/or diagnosis of SARS-CoV-2 by FDA under an Emergency Use Authorization (EUA).  This EUA will remain in effect (meaning this test can be used) for the duration of the COVID-19 declaration under Section 564(b)(1) of the Act, 21 U.S.C. section 360bbb-3(b)(1), unless the authorization is terminated or revoked sooner.  Performed at [facility], [place] [address] [place], [place] [address]   Blood culture (routine x 2)     Status: Abnormal   Collection Time: 01/30/20  3:20 AM   Specimen: BLOOD LEFT FOREARM  Result Value Ref Range Status   Specimen Description BLOOD LEFT FOREARM  Final   Special Requests   Final    BOTTLES DRAWN AEROBIC AND ANAEROBIC Blood Culture adequate volume   Culture  Setup Time   Final    GRAM NEGATIVE RODS Organism ID to follow CRITICAL RESULT CALLED TO, READ BACK BY AND VERIFIED WITH: K AMEND PHARMD 01/30/20 2329 JDW IN BOTH AEROBIC AND ANAEROBIC BOTTLES Performed at [facility], [place] [address], [place], [place] [address]    Culture ESCHERICHIA COLI (A)  Final   Report Status 02/02/2020 FINAL  Final   Organism ID, Bacteria ESCHERICHIA COLI  Final      Susceptibility   Escherichia coli - MIC*    AMPICILLIN >=32 RESISTANT Resistant     CEFAZOLIN 8 SENSITIVE Sensitive     CEFEPIME <=0.12 SENSITIVE Sensitive     CEFTAZIDIME <=1 SENSITIVE Sensitive     CEFTRIAXONE <=0.25 SENSITIVE Sensitive     CIPROFLOXACIN 1 SENSITIVE  Sensitive     GENTAMICIN >=16 RESISTANT Resistant     IMIPENEM <=0.25 SENSITIVE Sensitive     TRIMETH/SULFA <=20 SENSITIVE Sensitive     AMPICILLIN/SULBACTAM >=32 RESISTANT Resistant     PIP/TAZO <=4 SENSITIVE Sensitive     * ESCHERICHIA COLI  Blood Culture ID Panel (Reflexed)     Status: Abnormal   Collection Time: 01/30/20  3:20 AM  Result Value Ref Range Status   Enterococcus faecalis NOT DETECTED NOT DETECTED Final   Enterococcus Faecium NOT DETECTED NOT DETECTED Final   Listeria monocytogenes NOT DETECTED NOT DETECTED Final   Staphylococcus species NOT DETECTED NOT DETECTED Final   Staphylococcus aureus (BCID) NOT DETECTED NOT DETECTED Final   Staphylococcus epidermidis NOT DETECTED NOT DETECTED Final   Staphylococcus lugdunensis NOT DETECTED NOT DETECTED Final   Streptococcus species NOT DETECTED NOT DETECTED Final   Streptococcus agalactiae NOT DETECTED NOT DETECTED Final   Streptococcus pneumoniae NOT DETECTED NOT DETECTED Final   Streptococcus pyogenes NOT DETECTED NOT DETECTED  Final   A.calcoaceticus-baumannii NOT DETECTED NOT DETECTED Final   Bacteroides fragilis NOT DETECTED NOT DETECTED Final   Enterobacterales DETECTED (A) NOT DETECTED Final    Comment: Enterobacterales represent a large order of gram negative bacteria, not a single organism. CRITICAL RESULT CALLED TO, READ BACK BY AND VERIFIED WITH: K AMEND PHARMD 01/30/20 2329 JDW    Enterobacter cloacae complex NOT DETECTED NOT DETECTED Final   Escherichia coli DETECTED (A) NOT DETECTED Final    Comment: CRITICAL RESULT CALLED TO, READ BACK BY AND VERIFIED WITH: K AMEND PHARMD 01/30/20 2329 JDW    Klebsiella aerogenes NOT DETECTED NOT DETECTED Final   Klebsiella oxytoca NOT DETECTED NOT DETECTED Final   Klebsiella pneumoniae NOT DETECTED NOT DETECTED Final   Proteus species NOT DETECTED NOT DETECTED Final   Salmonella species NOT DETECTED NOT DETECTED Final   Serratia marcescens NOT DETECTED NOT DETECTED Final    Haemophilus influenzae NOT DETECTED NOT DETECTED Final   Neisseria meningitidis NOT DETECTED NOT DETECTED Final   Pseudomonas aeruginosa NOT DETECTED NOT DETECTED Final   Stenotrophomonas maltophilia NOT DETECTED NOT DETECTED Final   Candida albicans NOT DETECTED NOT DETECTED Final   Candida auris NOT DETECTED NOT DETECTED Final   Candida glabrata NOT DETECTED NOT DETECTED Final   Candida krusei NOT DETECTED NOT DETECTED Final   Candida parapsilosis NOT DETECTED NOT DETECTED Final   Candida tropicalis NOT DETECTED NOT DETECTED Final   Cryptococcus neoformans/gattii NOT DETECTED NOT DETECTED Final   CTX-M ESBL NOT DETECTED NOT DETECTED Final   Carbapenem resistance IMP NOT DETECTED NOT DETECTED Final   Carbapenem resistance KPC NOT DETECTED NOT DETECTED Final   Carbapenem resistance NDM NOT DETECTED NOT DETECTED Final   Carbapenem resist OXA 48 LIKE NOT DETECTED NOT DETECTED Final   Carbapenem resistance VIM NOT DETECTED NOT DETECTED Final    Comment: Performed at [facility], [address], [place], [place] [address]  Blood culture (routine x 2)     Status: None (Preliminary result)   Collection Time: 01/30/20  9:50 AM   Specimen: BLOOD LEFT WRIST  Result Value Ref Range Status   Specimen Description BLOOD LEFT WRIST  Final   Special Requests   Final    BOTTLES DRAWN AEROBIC AND ANAEROBIC Blood Culture results may not be optimal due to an inadequate volume of blood received in culture bottles   Culture   Final    NO GROWTH 3 DAYS Performed at [facility], [place] [address], [place], [place] [address]    Report Status PENDING  Incomplete  Culture, blood (single)     Status: None (Preliminary result)   Collection Time: 01/31/20  1:17 AM   Specimen: BLOOD  Result Value Ref Range Status   Specimen Description BLOOD LEFT ANTECUBITAL  Final   Special Requests   Final    BOTTLES DRAWN AEROBIC ONLY Blood Culture adequate volume   Culture   Final    NO GROWTH 2  DAYS Performed at [facility], [place] [address], [place], [place] [address]    Report Status PENDING  Incomplete     Labs: BNP (last 3 results) No results for input(s): BNP in the last 8760 hours. Basic Metabolic Panel: Recent Labs  Lab 01/30/20 0945 01/30/20 0945 01/30/20 1704 01/31/20 0118 01/31/20 0619 02/01/20 0556 02/02/20 0423  NA 142   < > 143 141 140 139 133*  K 3.0*   < > 3.2* 3.5 4.3 3.3* 3.4*  CL  104   < > 108 107 106 109 106  CO2 26   < > '22 25 24 25 22  ' GLUCOSE 206*   < > 204* 169* 239* 154* 128*  BUN 38*   < > 35* 26* 24* 13 7*  CREATININE 1.16*   < > 0.94 0.76 0.78 0.56 0.70  CALCIUM 7.8*   < > 8.0* 8.4* 8.0* 7.5* 7.6*  MG 1.9  --   --   --  1.7  --  1.4*   < > = values in this interval not displayed.   Liver Function Tests: No results for input(s): AST, ALT, ALKPHOS, BILITOT, PROT, ALBUMIN in the last 168 hours. No results for input(s): LIPASE, AMYLASE in the last 168 hours. No results for input(s): AMMONIA in the last 168 hours. CBC: Recent Labs  Lab 01/29/20 1740 01/30/20 0452 01/30/20 0945 02/01/20 0556  WBC 17.1*  --  16.2* 8.1  NEUTROABS  --   --  12.7*  --   HGB 14.8 16.7* 13.1 12.7  HCT 46.1* 49.0* 40.3 39.1  MCV 86.2  --  84.5 85.0  PLT 29*  --  25*  25* 53*   Cardiac Enzymes: No results for input(s): CKTOTAL, CKMB, CKMBINDEX, TROPONINI in the last 168 hours. BNP: Invalid input(s): POCBNP CBG: Recent Labs  Lab 02/01/20 1155 02/01/20 1611 02/01/20 2139 02/02/20 0600 02/02/20 1152  GLUCAP 243* 329* 299* 88 218*   D-Dimer No results for input(s): DDIMER in the last 72 hours. Hgb A1c No results for input(s): HGBA1C in the last 72 hours. Lipid Profile No results for input(s): CHOL, HDL, LDLCALC, TRIG, CHOLHDL, LDLDIRECT in the last 72 hours. Thyroid function studies No results for input(s): TSH, T4TOTAL, T3FREE, THYROIDAB in the last 72 hours.  Invalid input(s): FREET3 Anemia work up No results for input(s): VITAMINB12,  FOLATE, FERRITIN, TIBC, IRON, RETICCTPCT in the last 72 hours. Urinalysis    Component Value Date/Time   COLORURINE YELLOW 01/30/2020 [place] 01/30/2020 0439   LABSPEC 1.016 01/30/2020 0439   PHURINE 5.0 01/30/2020 0439   GLUCOSEU >=500 (A) 01/30/2020 0439   HGBUR MODERATE (A) 01/30/2020 0439   BILIRUBINUR NEGATIVE 01/30/2020 0439   KETONESUR 80 (A) 01/30/2020 0439   PROTEINUR NEGATIVE 01/30/2020 0439   NITRITE NEGATIVE 01/30/2020 0439   LEUKOCYTESUR TRACE (A) 01/30/2020 0439   Sepsis Labs Invalid input(s): PROCALCITONIN,  WBC,  LACTICIDVEN Microbiology Recent Results (from the past 240 hour(s))  SARS Coronavirus 2 by RT PCR (hospital order, performed in [facility] hospital lab) Nasopharyngeal Nasopharyngeal Swab     Status: None   Collection Time: 01/29/20 10:55 AM   Specimen: Nasopharyngeal Swab  Result Value Ref Range Status   SARS Coronavirus 2 NEGATIVE NEGATIVE Final    Comment: (NOTE) SARS-CoV-2 target nucleic acids are NOT DETECTED.  The SARS-CoV-2 RNA is generally detectable in upper and lower respiratory specimens during the acute phase of infection. The lowest concentration of SARS-CoV-2 viral copies this assay can detect is 250 copies / mL. A negative result does not preclude SARS-CoV-2 infection and should not be used as the sole basis for treatment or other patient management decisions.  A negative result may occur with improper specimen collection / handling, submission of specimen other than nasopharyngeal swab, presence of viral mutation(s) within the areas targeted by this assay, and inadequate number of viral copies (<250 copies / mL). A negative result must be combined with clinical observations, patient history, and epidemiological information.  Fact Sheet  for Patients:   StrictlyIdeas.no  Fact Sheet for Healthcare Providers: BankingDealers.co.za  This test is not yet approved or   cleared by the [place] FDA and has been authorized for detection and/or diagnosis of SARS-CoV-2 by FDA under an Emergency Use Authorization (EUA).  This EUA will remain in effect (meaning this test can be used) for the duration of the COVID-19 declaration under Section 564(b)(1) of the Act, 21 U.S.C. section 360bbb-3(b)(1), unless the authorization is terminated or revoked sooner.  Performed at [facility], [place] [address] [place], [place] [address]   SARS Coronavirus 2 by RT PCR (hospital order, performed in [facility] hospital lab) Nasopharyngeal Nasopharyngeal Swab     Status: None   Collection Time: 01/29/20 11:58 PM   Specimen: Nasopharyngeal Swab  Result Value Ref Range Status   SARS Coronavirus 2 NEGATIVE NEGATIVE Final    Comment: (NOTE) SARS-CoV-2 target nucleic acids are NOT DETECTED.  The SARS-CoV-2 RNA is generally detectable in upper and lower respiratory specimens during the acute phase of infection. The lowest concentration of SARS-CoV-2 viral copies this assay can detect is 250 copies / mL. A negative result does not preclude SARS-CoV-2 infection and should not be used as the sole basis for treatment or other patient management decisions.  A negative result may occur with improper specimen collection / handling, submission of specimen other than nasopharyngeal swab, presence of viral mutation(s) within the areas targeted by this assay, and inadequate number of viral copies (<250 copies / mL). A negative result must be combined with clinical observations, patient history, and epidemiological information.  Fact Sheet for Patients:   StrictlyIdeas.no  Fact Sheet for Healthcare Providers: BankingDealers.co.za  This test is not yet approved or  cleared by the [place] FDA and has been authorized for detection and/or diagnosis of SARS-CoV-2 by FDA under an Emergency Use Authorization (EUA).  This EUA  will remain in effect (meaning this test can be used) for the duration of the COVID-19 declaration under Section 564(b)(1) of the Act, 21 U.S.C. section 360bbb-3(b)(1), unless the authorization is terminated or revoked sooner.  Performed at [facility], [place] [address] [place], [place] [address]   Blood culture (routine x 2)     Status: Abnormal   Collection Time: 01/30/20  3:20 AM   Specimen: BLOOD LEFT FOREARM  Result Value Ref Range Status   Specimen Description BLOOD LEFT FOREARM  Final   Special Requests   Final    BOTTLES DRAWN AEROBIC AND ANAEROBIC Blood Culture adequate volume   Culture  Setup Time   Final    GRAM NEGATIVE RODS Organism ID to follow CRITICAL RESULT CALLED TO, READ BACK BY AND VERIFIED WITH: K AMEND PHARMD 01/30/20 2329 JDW IN BOTH AEROBIC AND ANAEROBIC BOTTLES Performed at [facility], [place] [address], [place], [place] [address]    Culture ESCHERICHIA COLI (A)  Final   Report Status 02/02/2020 FINAL  Final   Organism ID, Bacteria ESCHERICHIA COLI  Final      Susceptibility   Escherichia coli - MIC*    AMPICILLIN >=32 RESISTANT Resistant     CEFAZOLIN 8 SENSITIVE Sensitive     CEFEPIME <=0.12 SENSITIVE Sensitive     CEFTAZIDIME <=1 SENSITIVE Sensitive     CEFTRIAXONE <=0.25 SENSITIVE Sensitive     CIPROFLOXACIN 1 SENSITIVE Sensitive     GENTAMICIN >=16 RESISTANT Resistant     IMIPENEM <=0.25 SENSITIVE Sensitive     TRIMETH/SULFA <=20 SENSITIVE Sensitive  AMPICILLIN/SULBACTAM >=32 RESISTANT Resistant     PIP/TAZO <=4 SENSITIVE Sensitive     * ESCHERICHIA COLI  Blood Culture ID Panel (Reflexed)     Status: Abnormal   Collection Time: 01/30/20  3:20 AM  Result Value Ref Range Status   Enterococcus faecalis NOT DETECTED NOT DETECTED Final   Enterococcus Faecium NOT DETECTED NOT DETECTED Final   Listeria monocytogenes NOT DETECTED NOT DETECTED Final   Staphylococcus species NOT DETECTED NOT DETECTED Final   Staphylococcus aureus (BCID)  NOT DETECTED NOT DETECTED Final   Staphylococcus epidermidis NOT DETECTED NOT DETECTED Final   Staphylococcus lugdunensis NOT DETECTED NOT DETECTED Final   Streptococcus species NOT DETECTED NOT DETECTED Final   Streptococcus agalactiae NOT DETECTED NOT DETECTED Final   Streptococcus pneumoniae NOT DETECTED NOT DETECTED Final   Streptococcus pyogenes NOT DETECTED NOT DETECTED Final   A.calcoaceticus-baumannii NOT DETECTED NOT DETECTED Final   Bacteroides fragilis NOT DETECTED NOT DETECTED Final   Enterobacterales DETECTED (A) NOT DETECTED Final    Comment: Enterobacterales represent a large order of gram negative bacteria, not a single organism. CRITICAL RESULT CALLED TO, READ BACK BY AND VERIFIED WITH: K AMEND PHARMD 01/30/20 2329 JDW    Enterobacter cloacae complex NOT DETECTED NOT DETECTED Final   Escherichia coli DETECTED (A) NOT DETECTED Final    Comment: CRITICAL RESULT CALLED TO, READ BACK BY AND VERIFIED WITH: K AMEND PHARMD 01/30/20 2329 JDW    Klebsiella aerogenes NOT DETECTED NOT DETECTED Final   Klebsiella oxytoca NOT DETECTED NOT DETECTED Final   Klebsiella pneumoniae NOT DETECTED NOT DETECTED Final   Proteus species NOT DETECTED NOT DETECTED Final   Salmonella species NOT DETECTED NOT DETECTED Final   Serratia marcescens NOT DETECTED NOT DETECTED Final   Haemophilus influenzae NOT DETECTED NOT DETECTED Final   Neisseria meningitidis NOT DETECTED NOT DETECTED Final   Pseudomonas aeruginosa NOT DETECTED NOT DETECTED Final   Stenotrophomonas maltophilia NOT DETECTED NOT DETECTED Final   Candida albicans NOT DETECTED NOT DETECTED Final   Candida auris NOT DETECTED NOT DETECTED Final   Candida glabrata NOT DETECTED NOT DETECTED Final   Candida krusei NOT DETECTED NOT DETECTED Final   Candida parapsilosis NOT DETECTED NOT DETECTED Final   Candida tropicalis NOT DETECTED NOT DETECTED Final   Cryptococcus neoformans/gattii NOT DETECTED NOT DETECTED Final   CTX-M ESBL NOT  DETECTED NOT DETECTED Final   Carbapenem resistance IMP NOT DETECTED NOT DETECTED Final   Carbapenem resistance KPC NOT DETECTED NOT DETECTED Final   Carbapenem resistance NDM NOT DETECTED NOT DETECTED Final   Carbapenem resist OXA 48 LIKE NOT DETECTED NOT DETECTED Final   Carbapenem resistance VIM NOT DETECTED NOT DETECTED Final    Comment: Performed at [facility], [address] [place], [place] [address]  Blood culture (routine x 2)     Status: None (Preliminary result)   Collection Time: 01/30/20  9:50 AM   Specimen: BLOOD LEFT WRIST  Result Value Ref Range Status   Specimen Description BLOOD LEFT WRIST  Final   Special Requests   Final    BOTTLES DRAWN AEROBIC AND ANAEROBIC Blood Culture results may not be optimal due to an inadequate volume of blood received in culture bottles   Culture   Final    NO GROWTH 3 DAYS Performed at [facility], [place] [address], [place], [place] [address]    Report Status PENDING  Incomplete  Culture, blood (single)     Status: None (Preliminary result)  Collection Time: 01/31/20  1:17 AM   Specimen: BLOOD  Result Value Ref Range Status   Specimen Description BLOOD LEFT ANTECUBITAL  Final   Special Requests   Final    BOTTLES DRAWN AEROBIC ONLY Blood Culture adequate volume   Culture   Final    NO GROWTH 2 DAYS Performed at [facility], [address], [place],  [address]    Report Status PENDING  Incomplete     Time coordinating discharge in minutes: 65  SIGNED:   Debbe Odea, MD  [facility] 02/02/2020, 4:51 PM

## 2020-02-02 NOTE — Progress Notes (Signed)
Results for ARLIS, EVERLY (MRN [id number]) as of 02/02/2020 10:04  Ref. Range 02/01/2020 06:37 02/01/2020 11:55 02/01/2020 16:11 02/01/2020 21:39 02/02/2020 06:00  Glucose-Capillary Latest Ref Range: 70 - 99 mg/dL 248 (H) 185 (H) 909 (H) 299 (H) 88  Noted that postprandial blood sugars continue to be greater than 200 mg/dl. Fasting CBGs are within normal limits.  Recommend increasing Novolog meal coverage to 6 units TID with meals if patient eats at least 50% of meal.  Smith Mince RN BSN CDE Diabetes Coordinator Pager: [phone]  8am-5pm

## 2020-02-02 NOTE — Discharge Instructions (Addendum)

## 2020-02-02 NOTE — Progress Notes (Signed)
Patient alert and oriented, denies pain, v/ss. Educate the husband on how to draw up and given the patient insulin, husband was able to give patient insulin at lunch time, d/c instruction and prescriptions explain and given to the son, all questions answer, family verbalized understanding. Iv and tele d/c, patient d/c home per order.

## 2020-02-02 NOTE — Plan of Care (Signed)
°  RD consulted for nutrition education regarding diabetes.   No results found for: HGBA1C  Spoke with pt husband at bedside, who just administered insulin injection, which he felt comfortable doing. He reports pt consumes 2-3 meals per day, which consist of soup (rice and broth) and meat, starch, and vegetable. Pt also snacks on a lot of fruit throughout the day. Beverages consist of juice, water, tea with honey, and coffee with milk.   Spent 45 minutes with husband discussing pathophysiology and DM self-management. Per discussion with RN, MD waiting RD consult prior to discharging pt.   Also referred to to outpatient diabetes educations through [facility] for further reinforcement.   RD provided "Carbohydrate Counting for People with Diabetes" handout from the Academy of Nutrition and Dietetics. Discussed different food groups and their effects on blood sugar, emphasizing carbohydrate-containing foods. Provided list of carbohydrates and recommended serving sizes of common foods.  Discussed importance of controlled and consistent carbohydrate intake throughout the day. Provided examples of ways to balance meals/snacks and encouraged intake of high-fiber, whole grain complex carbohydrates. Teach back method used.  Expect fair to good compliance.  Current diet order is carb modified/ heart healthy, patient is consuming approximately 60-75% of meals at this time. Labs and medications reviewed. No further nutrition interventions warranted at this time. RD contact information provided. If additional nutrition issues arise, please re-consult RD.  Levada Schilling, RD, LDN, CDCES Registered Dietitian II Certified Diabetes Care and Education Specialist Please refer to [facility] for RD and/or RD on-call/weekend/after hours pager

## 2020-02-02 NOTE — Evaluation (Signed)
Occupational Therapy Evaluation Patient Details Name: Jillian Travis MRN: [id number] DOB: 22-Sep-1950 Today's Date: 02/02/2020    History of Present Illness Patient is a 69 y/o female who presents with weakness, s/p fall from walking dog a few days PTA. Found to have leukocytosis, DKA, sepsis and A-fib with RVR. Pt with new diagosis of DM. PMH includes HLD.   Clinical Impression   Pt admitted with the above. Pt currently with functional limitations due to the deficits listed below (see OT Problem List).  Pt will benefit from skilled OT to increase their safety and independence with ADL and functional mobility for ADL to facilitate discharge to venue listed below.   Husband very supportive and encouraging. Pt very willing to participate.       Follow Up Recommendations  No OT follow up;Supervision/Assistance - 24 hour    Equipment Recommendations  None recommended by OT    Recommendations for Other Services       Precautions / Restrictions Precautions Precautions: Fall Precaution Comments: very HOH      Mobility Bed Mobility   Bed Mobility: Supine to Sit;Sit to Supine     Supine to sit: Supervision Sit to supine: Supervision      Transfers Overall transfer level: Needs assistance Equipment used: 1 person hand held assist Transfers: Sit to/from Stand;Stand Pivot Transfers Sit to Stand: Min guard;Supervision Stand pivot transfers: Supervision;Min guard            Balance Overall balance assessment: Mild deficits observed, not formally tested                                         ADL either performed or assessed with clinical judgement   ADL Overall ADL's : Needs assistance/impaired Eating/Feeding: Set up;Sitting   Grooming: Standing;Minimal assistance   Upper Body Bathing: Set up;Sitting   Lower Body Bathing: Minimal assistance;Sit to/from stand;Cueing for safety;Cueing for sequencing   Upper Body Dressing : Set up;Sitting   Lower Body  Dressing: Minimal assistance;Sit to/from stand;Cueing for safety;Cueing for sequencing   Toilet Transfer: Minimal assistance;Ambulation;Regular Toilet   Toileting- Clothing Manipulation and Hygiene: Minimal assistance;Sit to/from stand       Functional mobility during ADLs: Minimal assistance General ADL Comments: husband will provide 24/7 A with AdL activity     Vision Patient Visual Report: No change from baseline              Pertinent Vitals/Pain Pain Assessment: No/denies pain     Hand Dominance     Extremity/Trunk Assessment Upper Extremity Assessment Upper Extremity Assessment: Overall WFL for tasks assessed           Communication     Cognition Arousal/Alertness: Awake/alert Behavior During Therapy: WFL for tasks assessed/performed Overall Cognitive Status: Within Functional Limits for tasks assessed                                     General Comments  spouse present. spouse speaks english            Home Living Family/patient expects to be discharged to:: Private residence Living Arrangements: Spouse/significant other Available Help at Discharge: Family Type of Home: House Home Access: Stairs to enter Secretary/administrator of Steps: 3-4   Home Layout: One level     Bathroom Shower/Tub: Arts development officer  Toilet: Standard     Home Equipment: None          Prior Functioning/Environment Level of Independence: Independent        Comments: Drives, cooks, cleans.        OT Problem List: Decreased strength;Impaired balance (sitting and/or standing);Decreased knowledge of use of DME or AE      OT Treatment/Interventions: Self-care/ADL training;Patient/family education;DME and/or AE instruction    OT Goals(Current goals can be found in the care plan section) Acute Rehab OT Goals Patient Stated Goal: bettter OT Goal Formulation: With patient/family Time For Goal Achievement: 02/16/20 Potential to Achieve  Goals: Good ADL Goals Pt Will Perform Grooming: with modified independence;standing Pt Will Perform Lower Body Dressing: with modified independence;sit to/from stand Pt Will Transfer to Toilet: with modified independence Pt Will Perform Toileting - Clothing Manipulation and hygiene: sit to/from stand;with modified independence  OT Frequency: Min 2X/week    AM-PAC OT "6 Clicks" Daily Activity     Outcome Measure Help from another person eating meals?: None Help from another person taking care of personal grooming?: A Little Help from another person toileting, which includes using toliet, bedpan, or urinal?: A Little Help from another person bathing (including washing, rinsing, drying)?: A Little Help from another person to put on and taking off regular upper body clothing?: None Help from another person to put on and taking off regular lower body clothing?: A Little 6 Click Score: 20   End of Session Nurse Communication: Mobility status  Activity Tolerance: Patient tolerated treatment well Patient left: in bed;with call bell/phone within reach;with family/visitor present  OT Visit Diagnosis: Unsteadiness on feet (R26.81);Other abnormalities of gait and mobility (R26.89);Muscle weakness (generalized) (M62.81)                Time: 8341-9622 OT Time Calculation (min): 19 min Charges:  OT General Charges $OT Visit: 1 Visit OT Evaluation $OT Eval Low Complexity: 1 Low  Lise Auer, OT Acute Rehabilitation Services [phone] Office- [phone]     Shadaya Marschner, Karin Golden D 02/02/2020, 11:05 AM

## 2020-02-03 LAB — HEMOGLOBIN A1C
Hgb A1c MFr Bld: >15.5 % — ABNORMAL HIGH (ref 4.8–5.6)
Mean Plasma Glucose: >398 mg/dL

## 2020-02-04 LAB — CULTURE, BLOOD (ROUTINE X 2): Culture: NO GROWTH

## 2020-02-05 LAB — CULTURE, BLOOD (SINGLE)
Culture: NO GROWTH
Special Requests: ADEQUATE

## 2020-02-18 NOTE — Congregational Nurse Program (Signed)
CN office visit with interpreter Diu Hartshorn assisting.  Patient was hospitalized at [facility] from 9/3-02/02/2020 for diabetes.  She saw her PCP Dr. Jeannetta Nap on 02/03/2020 who changed discharge medications to Metformin 1000 mg 1/2 tablet every 6 hours and Novolin 70/30 every morning because she has no insurance and could not afford Novolog and Lantus. She has not been checking blood sugars. She and husband agreed to go to [facility] and pick up glucometer, test strips and lancets. CN and interpreter home visited after they picked up supplies and demonstrated how to check blood glucose.  We also assisted with Medicaid application because patient is not currently eligible to apply for Medicaid part B or D. Advised the that if Medicaid is not approved we can help them apply for [facility] Financial assistance. Brantley Fling RN, Congregational Nurse [phone]

## 2020-02-26 ENCOUNTER — Ambulatory Visit: Payer: Self-pay | Admitting: Skilled Nursing Facility1

## 2020-03-03 NOTE — Congregational Nurse Program (Signed)
CN office visit with interpreter Diu Hartshorn.  States she has been checking her blood sugar BID.  At 6 am this morning it was 205 before breakfast and 351 yesterday at 6 pm which was 4 hours after eating lunch.  Phone call to Dr. Windle Guard to report results and he advised patient to increase Metformin from 1500 mg to 2000 mg per day (1 tablet twice a day).  She will see him next week for follow-up. Given literature on high and low blood sugars and overview of diabetes in [place]  Brantley Fling RN, Congregational Nurse [phone]

## 2020-06-30 NOTE — Congregational Nurse Program (Signed)
CN office visit.  Assisted patient in completing [facility] Financial Assistance Application.  Brantley Fling RN, Congregational Nurse [phone]

## 2020-10-06 NOTE — Congregational Nurse Program (Signed)
CN office visit with interpreter Diu Hartshorn assisting.  Patient received letter from [facility] stating she needs to provide name of PCP. Phone call completed to tell them Dr. Windle Guard at [facility] is PCP.  She also mentioned vision difficulty and states she was seen at [facility] several months ago and needs cataract surgery but due to lack of insurance at that time she could not get it.  Phone call to [facility] who checked and said that her Medicaid is now active and she needs to come in for evaluation.  Co-Pay would be $3.  Appointment scheduled with Dr. Marchelle Gearing on June 20. 2022 at 8:30 am.  Brantley Fling RN, Congregational Nurse [phone]

## 2020-10-08 ENCOUNTER — Telehealth: Payer: Self-pay

## 2020-10-08 NOTE — Telephone Encounter (Signed)
Phone call to tell patient she has an appointment scheduled with [facility] on June 20,2022 @ 8:30 am and that her Medicaid will pay for the appointment with a $3 co-pay.  Brantley Fling RN, Congregational Nurse [phone]

## 2021-06-28 ENCOUNTER — Other Ambulatory Visit: Payer: Self-pay | Admitting: Family Medicine

## 2021-06-28 DIAGNOSIS — Z1231 Encounter for screening mammogram for malignant neoplasm of breast: Secondary | ICD-10-CM

## 2021-07-15 ENCOUNTER — Ambulatory Visit: Payer: Self-pay

## 2022-02-25 IMAGING — CT CT HEAD W/O CM
4 series · 15 of 47 positions shown, 17 images · non-contrast
Comparison: None.

CLINICAL DATA: Head trauma.

EXAM:
CT HEAD WITHOUT CONTRAST
TECHNIQUE: Contiguous axial images were obtained from the base of the skull
through the vertex without intravenous contrast.

[Series 3: head without · axial · non-contrast · 0.39mm/px · z∈[-161,-61]mm · 7 of 28 slices shown, 9 images]
[im 4/28  brain]
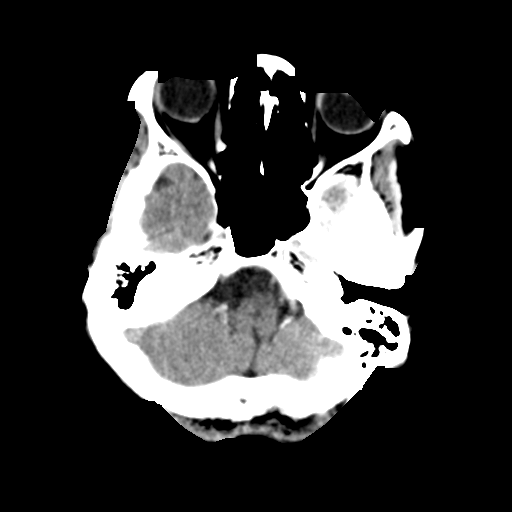
[im 4/28  bone]
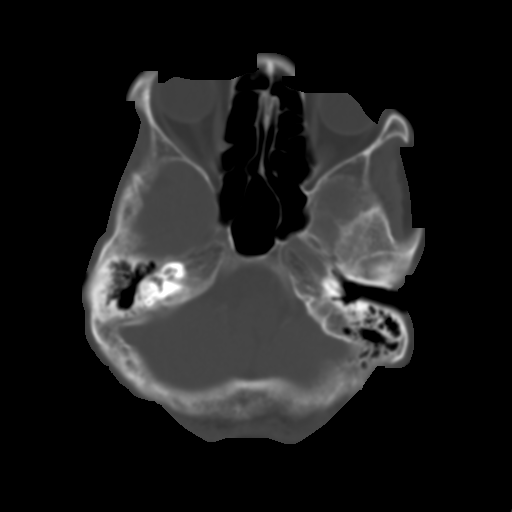
[im 7/28  brain]
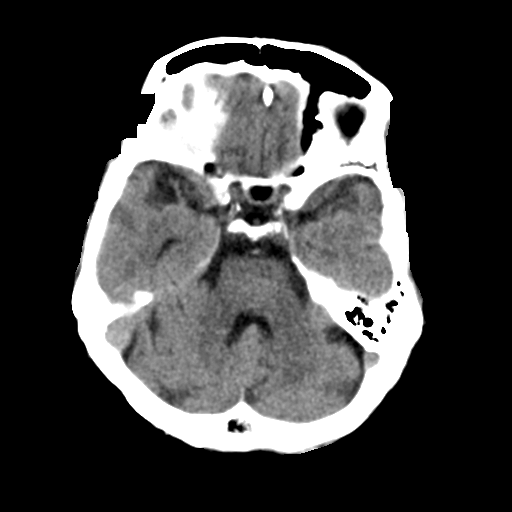
[im 11/28  brain]
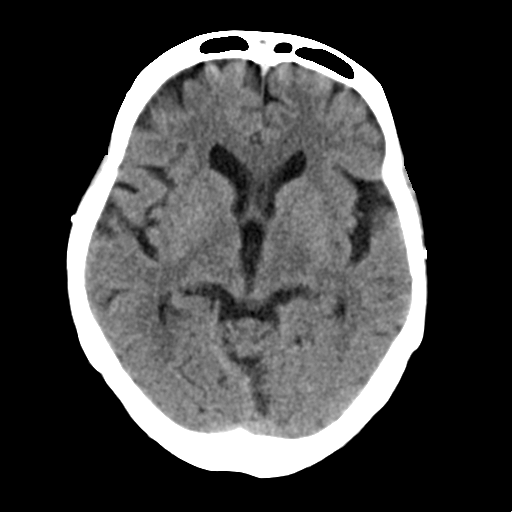
[im 14/28  brain]
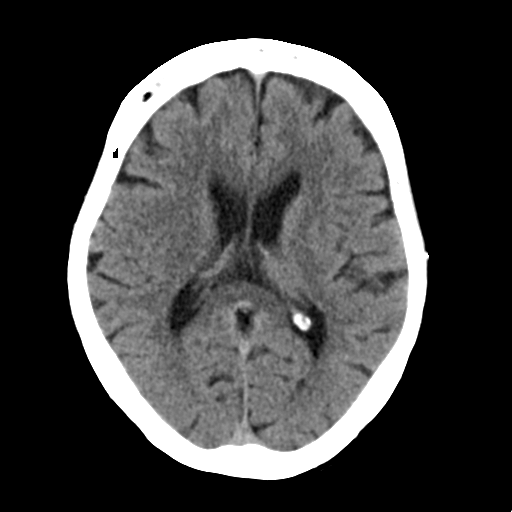
[im 17/28  brain]
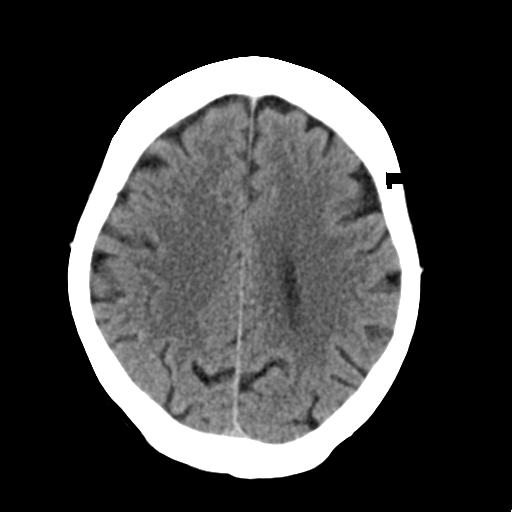
[im 17/28  bone]
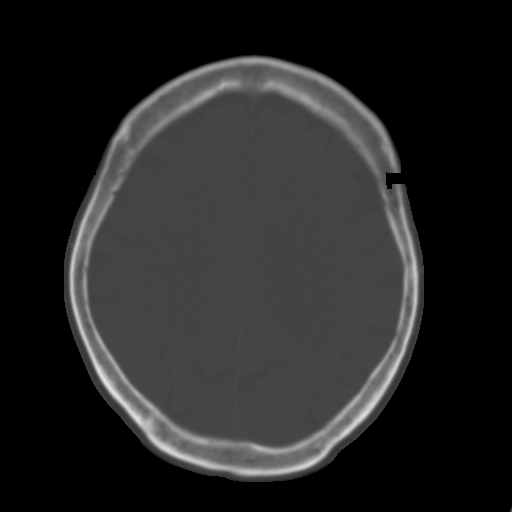
[im 21/28  brain]
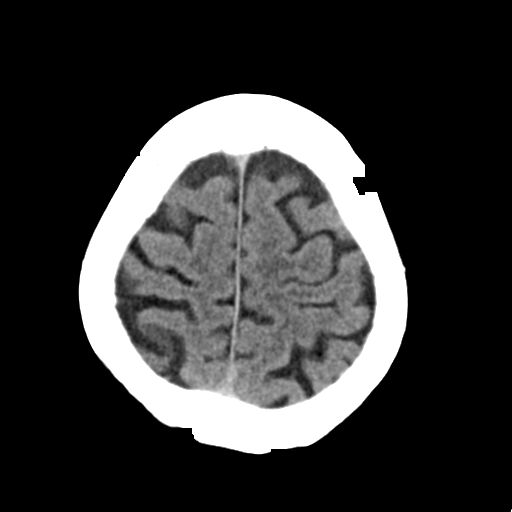
[im 24/28  brain]
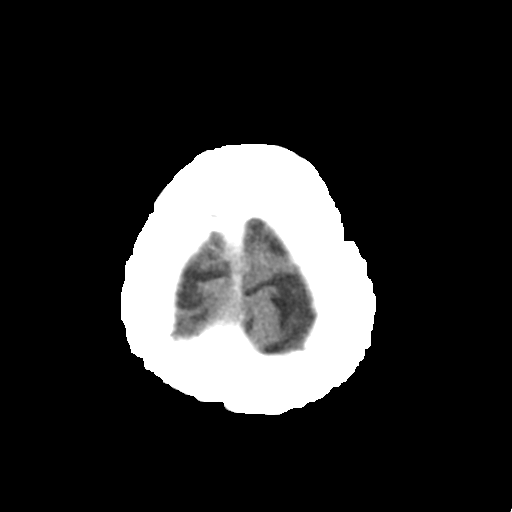

[Series 4: head bone · axial · 0.39mm/px · z∈[-164,-150]mm · 2 of 70 slices shown]
[im 7/70  bone]
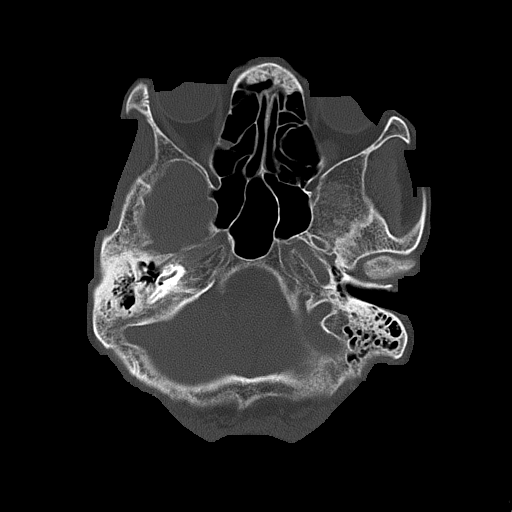
[im 14/70  bone]
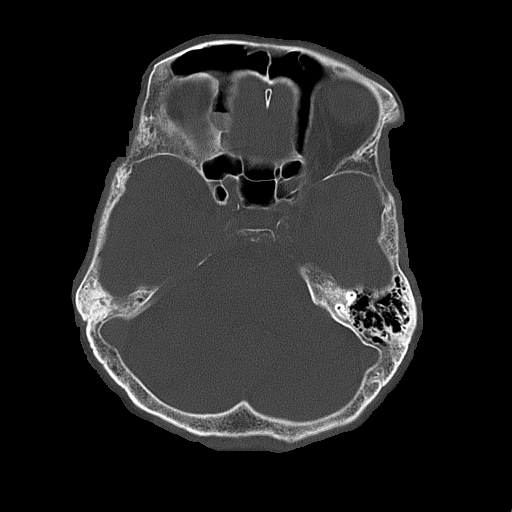

[Series 5: head without cor · coronal · non-contrast · 0.28mm/px · 3 of 67 slices shown]
[im 23/67  brain]
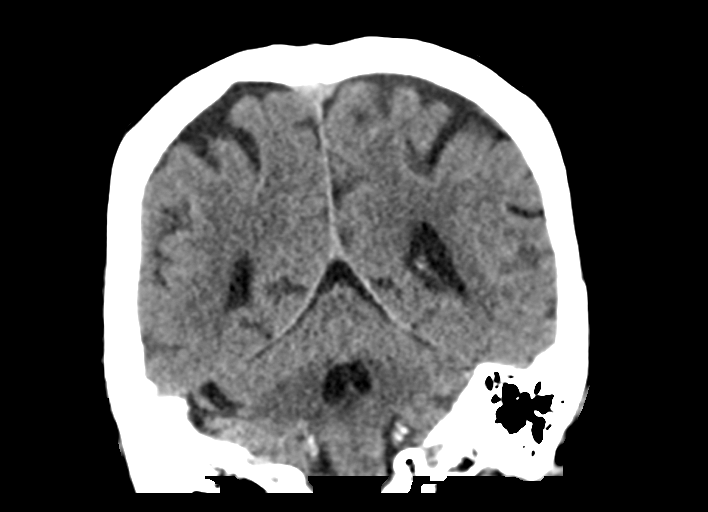
[im 30/67  brain]
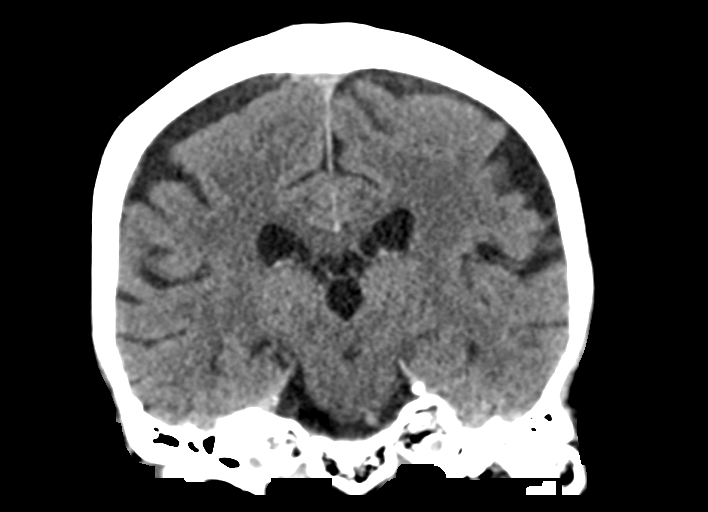
[im 37/67  brain]
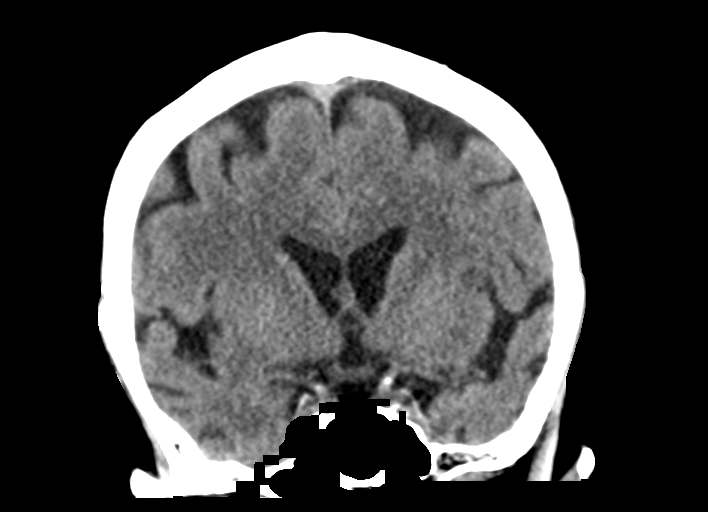

[Series 6: head without sag · sagittal · non-contrast · 0.28mm/px · 3 of 67 slices shown]
[im 23/67  brain]
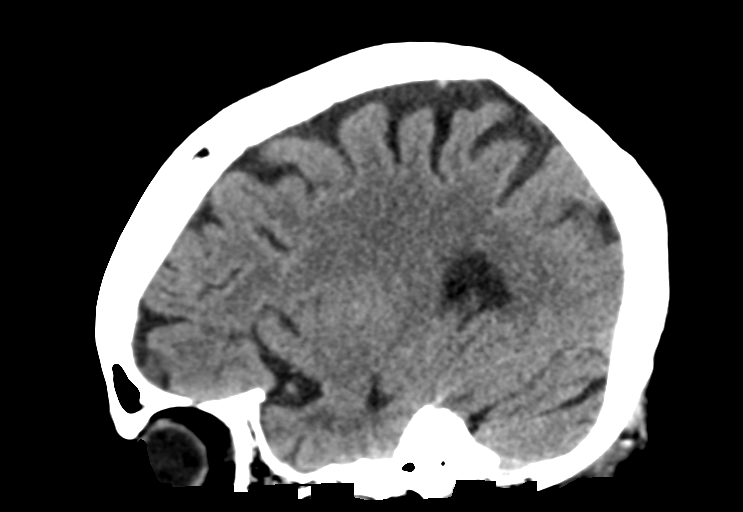
[im 34/67  brain]
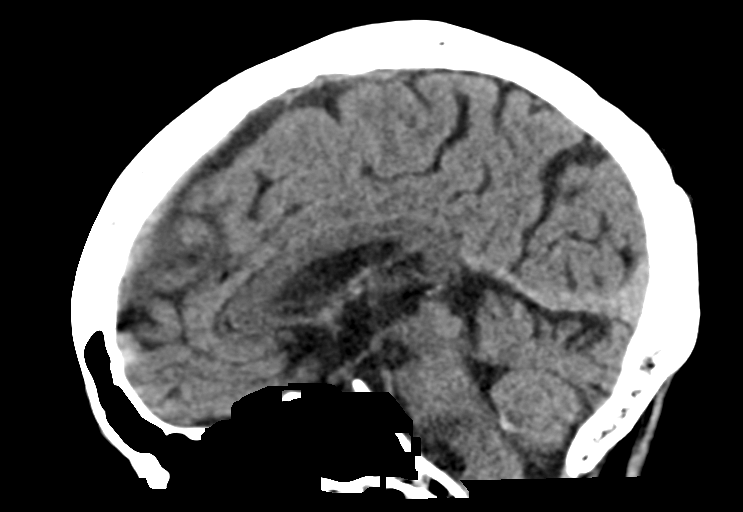
[im 45/67  brain]
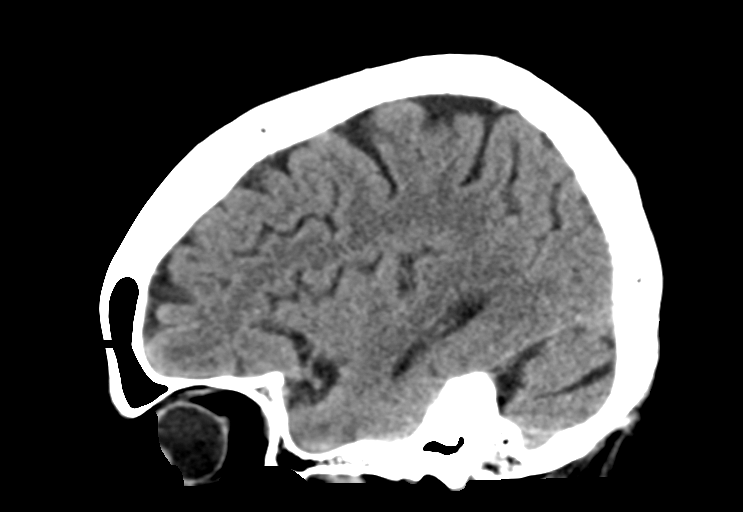

[15 of 47 positions shown; findings below may reference images not displayed]

FINDINGS: Brain: No evidence of acute infarction, hemorrhage, hydrocephalus,
extra-axial collection or mass lesion/mass effect.

Vascular: No hyperdense vessel or unexpected calcification.

Skull: Normal. Negative for fracture or focal lesion.

Sinuses/Orbits: Globes and orbits are unremarkable. The visualized
sinuses and mastoid air cells are clear.

Other: None.
IMPRESSION: Negative unenhanced CT scan of the brain.

## 2022-07-18 ENCOUNTER — Emergency Department (HOSPITAL_COMMUNITY): Payer: 59

## 2022-07-18 ENCOUNTER — Observation Stay (HOSPITAL_COMMUNITY)
Admission: EM | Admit: 2022-07-18 | Discharge: 2022-07-20 | Disposition: A | Discharge status: Home / Self Care | Payer: 59 | Attending: Internal Medicine | Admitting: Internal Medicine

## 2022-07-18 ENCOUNTER — Other Ambulatory Visit: Payer: Self-pay

## 2022-07-18 ENCOUNTER — Encounter (HOSPITAL_COMMUNITY): Payer: Self-pay

## 2022-07-18 DIAGNOSIS — I700 Atherosclerosis of aorta: Secondary | ICD-10-CM | POA: Insufficient documentation

## 2022-07-18 DIAGNOSIS — Z794 Long term (current) use of insulin: Secondary | ICD-10-CM | POA: Insufficient documentation

## 2022-07-18 DIAGNOSIS — E1165 Type 2 diabetes mellitus with hyperglycemia: Secondary | ICD-10-CM | POA: Diagnosis present

## 2022-07-18 DIAGNOSIS — R5383 Other fatigue: Secondary | ICD-10-CM | POA: Insufficient documentation

## 2022-07-18 DIAGNOSIS — Z7984 Long term (current) use of oral hypoglycemic drugs: Secondary | ICD-10-CM | POA: Diagnosis not present

## 2022-07-18 DIAGNOSIS — R42 Dizziness and giddiness: Secondary | ICD-10-CM | POA: Diagnosis not present

## 2022-07-18 DIAGNOSIS — E1100 Type 2 diabetes mellitus with hyperosmolarity without nonketotic hyperglycemic-hyperosmolar coma (NKHHC): Secondary | ICD-10-CM | POA: Diagnosis not present

## 2022-07-18 DIAGNOSIS — I509 Heart failure, unspecified: Secondary | ICD-10-CM | POA: Diagnosis not present

## 2022-07-18 DIAGNOSIS — E785 Hyperlipidemia, unspecified: Secondary | ICD-10-CM | POA: Insufficient documentation

## 2022-07-18 DIAGNOSIS — H9190 Unspecified hearing loss, unspecified ear: Secondary | ICD-10-CM | POA: Diagnosis not present

## 2022-07-18 DIAGNOSIS — Z1152 Encounter for screening for COVID-19: Secondary | ICD-10-CM | POA: Diagnosis not present

## 2022-07-18 HISTORY — DX: Type 2 diabetes mellitus with hyperosmolarity without nonketotic hyperglycemic-hyperosmolar coma (NKHHC): E11.00

## 2022-07-18 LAB — HEPATIC FUNCTION PANEL
ALT: 14 U/L (ref 0–44)
AST: 25 U/L (ref 15–41)
Albumin: 3.5 g/dL (ref 3.5–5.0)
Alkaline Phosphatase: 85 U/L (ref 38–126)
Bilirubin, Direct: 0.3 mg/dL — ABNORMAL HIGH (ref 0.0–0.2)
Indirect Bilirubin: 0.4 mg/dL (ref 0.3–0.9)
Total Bilirubin: 0.7 mg/dL (ref 0.3–1.2)
Total Protein: 6.8 g/dL (ref 6.5–8.1)

## 2022-07-18 LAB — BASIC METABOLIC PANEL
Anion gap: 10 (ref 5–15)
Anion gap: 5 (ref 5–15)
Anion gap: 3 — ABNORMAL LOW (ref 5–15)
BUN: 18 mg/dL (ref 8–23)
BUN: 10 mg/dL (ref 8–23)
BUN: 10 mg/dL (ref 8–23)
CO2: 23 mmol/L (ref 22–32)
CO2: 25 mmol/L (ref 22–32)
CO2: 28 mmol/L (ref 22–32)
Calcium: 9.1 mg/dL (ref 8.9–10.3)
Calcium: 7.8 mg/dL — ABNORMAL LOW (ref 8.9–10.3)
Calcium: 8.3 mg/dL — ABNORMAL LOW (ref 8.9–10.3)
Chloride: 92 mmol/L — ABNORMAL LOW (ref 98–111)
Chloride: 106 mmol/L (ref 98–111)
Chloride: 105 mmol/L (ref 98–111)
Creatinine, Ser: 1.2 mg/dL — ABNORMAL HIGH (ref 0.44–1.00)
Creatinine, Ser: 0.86 mg/dL (ref 0.44–1.00)
Creatinine, Ser: 0.79 mg/dL (ref 0.44–1.00)
GFR, Estimated: 48 mL/min — ABNORMAL LOW (ref >60)
GFR, Estimated: >60 mL/min (ref >60)
GFR, Estimated: >60 mL/min (ref >60)
Glucose, Bld: 781 mg/dL (ref 70–99)
Glucose, Bld: 217 mg/dL — ABNORMAL HIGH (ref 70–99)
Glucose, Bld: 204 mg/dL — ABNORMAL HIGH (ref 70–99)
Potassium: 4.7 mmol/L (ref 3.5–5.1)
Potassium: 2.9 mmol/L — ABNORMAL LOW (ref 3.5–5.1)
Potassium: 3.8 mmol/L (ref 3.5–5.1)
Sodium: 125 mmol/L — ABNORMAL LOW (ref 135–145)
Sodium: 136 mmol/L (ref 135–145)
Sodium: 136 mmol/L (ref 135–145)

## 2022-07-18 LAB — BLOOD GAS, VENOUS
Acid-Base Excess: 4.2 mmol/L — ABNORMAL HIGH (ref 0.0–2.0)
Bicarbonate: 31.1 mmol/L — ABNORMAL HIGH (ref 20.0–28.0)
O2 Saturation: 28.3 %
Patient temperature: 37
pCO2, Ven: 55 mmHg (ref 44–60)
pH, Ven: 7.36 (ref 7.25–7.43)
pO2, Ven: <31 mmHg — CL (ref 32–45)

## 2022-07-18 LAB — URINALYSIS, ROUTINE W REFLEX MICROSCOPIC
Bilirubin Urine: NEGATIVE
Glucose, UA: >=500 mg/dL — AB
Hgb urine dipstick: NEGATIVE
Ketones, ur: 5 mg/dL — AB
Nitrite: NEGATIVE
Protein, ur: NEGATIVE mg/dL
Specific Gravity, Urine: 1.024 (ref 1.005–1.030)
pH: 6 (ref 5.0–8.0)

## 2022-07-18 LAB — CBC
HCT: 37.8 % (ref 36.0–46.0)
HCT: 35.8 % — ABNORMAL LOW (ref 36.0–46.0)
Hemoglobin: 13.1 g/dL (ref 12.0–15.0)
Hemoglobin: 12.3 g/dL (ref 12.0–15.0)
MCH: 28.7 pg (ref 26.0–34.0)
MCH: 28.7 pg (ref 26.0–34.0)
MCHC: 34.7 g/dL (ref 30.0–36.0)
MCHC: 34.4 g/dL (ref 30.0–36.0)
MCV: 82.9 fL (ref 80.0–100.0)
MCV: 83.6 fL (ref 80.0–100.0)
Platelets: 199 10*3/uL (ref 150–400)
Platelets: 153 10*3/uL (ref 150–400)
RBC: 4.56 MIL/uL (ref 3.87–5.11)
RBC: 4.28 MIL/uL (ref 3.87–5.11)
RDW: 11.9 % (ref 11.5–15.5)
RDW: 11.9 % (ref 11.5–15.5)
WBC: 4.5 10*3/uL (ref 4.0–10.5)
WBC: 5.7 10*3/uL (ref 4.0–10.5)
nRBC: 0 % (ref 0.0–0.2)
nRBC: 0 % (ref 0.0–0.2)

## 2022-07-18 LAB — CBG MONITORING, ED
Glucose-Capillary: 145 mg/dL — ABNORMAL HIGH (ref 70–99)
Glucose-Capillary: 157 mg/dL — ABNORMAL HIGH (ref 70–99)
Glucose-Capillary: 165 mg/dL — ABNORMAL HIGH (ref 70–99)
Glucose-Capillary: 174 mg/dL — ABNORMAL HIGH (ref 70–99)
Glucose-Capillary: 191 mg/dL — ABNORMAL HIGH (ref 70–99)
Glucose-Capillary: 306 mg/dL — ABNORMAL HIGH (ref 70–99)
Glucose-Capillary: 441 mg/dL — ABNORMAL HIGH (ref 70–99)
Glucose-Capillary: 464 mg/dL — ABNORMAL HIGH (ref 70–99)
Glucose-Capillary: >600 mg/dL (ref 70–99)
Glucose-Capillary: >600 mg/dL (ref 70–99)

## 2022-07-18 LAB — RESP PANEL BY RT-PCR (RSV, FLU A&B, COVID)  RVPGX2
Influenza A by PCR: NEGATIVE
Influenza B by PCR: NEGATIVE
Resp Syncytial Virus by PCR: NEGATIVE
SARS Coronavirus 2 by RT PCR: NEGATIVE

## 2022-07-18 LAB — BRAIN NATRIURETIC PEPTIDE: B Natriuretic Peptide: 78.1 pg/mL (ref 0.0–100.0)

## 2022-07-18 LAB — MAGNESIUM: Magnesium: 1.8 mg/dL (ref 1.7–2.4)

## 2022-07-18 LAB — BETA-HYDROXYBUTYRIC ACID
Beta-Hydroxybutyric Acid: 0.65 mmol/L — ABNORMAL HIGH (ref 0.05–0.27)
Beta-Hydroxybutyric Acid: 0.26 mmol/L (ref 0.05–0.27)

## 2022-07-18 LAB — OSMOLALITY: Osmolality: 297 mOsm/kg — ABNORMAL HIGH (ref 275–295)

## 2022-07-18 MED ORDER — POTASSIUM CHLORIDE 10 MEQ/100ML IV SOLN
10.0000 meq | INTRAVENOUS | Status: AC
  Administered 2022-07-18 (×2): 10 meq via INTRAVENOUS
  Filled 2022-07-18 (×2): qty 100

## 2022-07-18 MED ORDER — INSULIN GLARGINE-YFGN 100 UNIT/ML ~~LOC~~ SOLN: 15.0000 [IU] | Freq: Every day | SUBCUTANEOUS | Status: DC

## 2022-07-18 MED ORDER — SODIUM CHLORIDE 0.9 % IV BOLUS (SEPSIS)
1000.0000 mL | Freq: Once | INTRAVENOUS | Status: AC
  Administered 2022-07-18: 1000 mL via INTRAVENOUS

## 2022-07-18 MED ORDER — LACTATED RINGERS IV SOLN: INTRAVENOUS | Status: DC

## 2022-07-18 MED ORDER — INSULIN REGULAR(HUMAN) IN NACL 100-0.9 UT/100ML-% IV SOLN
INTRAVENOUS | Status: DC
  Administered 2022-07-18: 0.7 [IU]/h via INTRAVENOUS

## 2022-07-18 MED ORDER — DEXTROSE IN LACTATED RINGERS 5 % IV SOLN: INTRAVENOUS | Status: DC

## 2022-07-18 MED ORDER — DEXTROSE 50 % IV SOLN: 0.0000 mL | INTRAVENOUS | Status: DC | PRN

## 2022-07-18 MED ORDER — INSULIN REGULAR(HUMAN) IN NACL 100-0.9 UT/100ML-% IV SOLN
INTRAVENOUS | Status: DC
  Administered 2022-07-18: 8 [IU]/h via INTRAVENOUS
  Filled 2022-07-18: qty 100

## 2022-07-18 MED ORDER — ENOXAPARIN SODIUM 40 MG/0.4ML IJ SOSY
40.0000 mg | PREFILLED_SYRINGE | INTRAMUSCULAR | Status: DC
  Administered 2022-07-18 – 2022-07-19 (×2): 40 mg via SUBCUTANEOUS
  Filled 2022-07-18 (×2): qty 0.4

## 2022-07-18 NOTE — Assessment & Plan Note (Signed)
Patient with DM2 HHS on insulin infusion. Glucose has come down from 781 to 306.   Plan Continue hyperglycemia protocol  Resume lantus when serum glucose is corrected.

## 2022-07-18 NOTE — ED Notes (Signed)
Per Elgie Congo, MD give both boluses of IVF first then give insulin.

## 2022-07-18 NOTE — ED Triage Notes (Signed)
Went to PCP yesterday for check up and PCP called this morning and told her that her blood draw showed her CBG >600.  Complains of dizziness.  Denies n/v abd pain or headache.

## 2022-07-18 NOTE — H&P (Signed)
History and Physical    Jillian Travis [id number] DOB: 24-Nov-1950 DOA: 07/18/2022  DOS: the patient was seen and examined on 07/18/2022  PCP: Leonard Downing, MD   Patient coming from: Home  I have personally briefly reviewed patient's old medical records in [place]  [name], a 72 y/o [place] woman with little english language skills has a h/o DM supposedly on Lantus qHS. She was seen by her primary care team Monday and had lab work which revealed severe hyper glycemia and she was referred to [facility] for evaluation.   ED Course: T 98.3 172/74  HR 71  RR 17. Patient was in no distress on presentation and was fully cognitive. CBG >600. Lab - Na 125, corrects to 136, Cr 1.2 BNP 78.1, CBCD nl. Patient was given 2 L NS and started on LR at 125cc/hr. She was started on insulin infusion protocol  Review of Systems:  Review of Systems  Unable to perform ROS: Language    Past Medical History:  Diagnosis Date   Diabetes ([place])    HLD (hyperlipidemia)    Type 2 diabetes mellitus with hyperosmolar hyperglycemic state (HHS) ([place])     History reviewed. No pertinent surgical history.  Soc Hx - married x 3, lives with 3 rd husband and son. She has two daughters from previous unions. She is retired - Worked in a Pension scheme manager.   reports that she has never smoked. She has never used smokeless tobacco. She reports that she does not drink alcohol and does not use drugs.  Allergies  Allergen Reactions   Other Other (See Comments)    Canine dander allergy- Per interpreter, the patient stated she experienced "dizziness and arm pain" after exposure to a dog and his collar (could have been the collar, if it was medicated??)    History reviewed. No pertinent family history.  Prior to Admission medications   Medication Sig Start Date End Date Taking? Authorizing Provider  blood glucose meter kit and supplies KIT Dispense based on patient and insurance preference. Use up to four times  daily as directed. (FOR ICD-9 250.00, 250.01). 02/02/20   Debbe Odea, MD  insulin aspart (NOVOLOG) 100 UNIT/ML injection Inject 5 Units into the skin 3 (three) times daily with meals. 02/02/20   Debbe Odea, MD  insulin aspart (NOVOLOG) 100 UNIT/ML injection Inject 0-5 Units into the skin at bedtime. CBG 70 - 120: 0 units  CBG 121 - 150: 0 units  CBG 151 - 200: 0 units  CBG 201 - 250: 2 units  CBG 251 - 300: 3 units  CBG 301 - 350: 4 units  CBG 351 - 400: 5 units 02/02/20   Debbe Odea, MD  insulin glargine (LANTUS) 100 UNIT/ML injection Inject 0.15 mLs (15 Units total) into the skin at bedtime. 02/02/20   Debbe Odea, MD    Physical Exam: Vitals:   07/18/22 1500 07/18/22 1530 07/18/22 1545 07/18/22 1627  BP: (!) 155/72 (!) 172/74  (!) 143/97  Pulse: 66 71  74  Resp: 18 17  (!) 22  Temp:   98.3 F (36.8 C)   TempSrc:   Oral   SpO2: 99% 100%  100%  Weight:      Height:        Physical Exam Vitals and nursing note reviewed.  Constitutional:      Appearance: Normal appearance.     Comments: underweight  HENT:     Head: Normocephalic and atraumatic.  Mouth/Throat:     Mouth: Mucous membranes are dry.     Pharynx: Oropharynx is clear.     Comments: Missing most of her teeth. Eyes:     Extraocular Movements: Extraocular movements intact.     Conjunctiva/sclera: Conjunctivae normal.     Pupils: Pupils are equal, round, and reactive to light.  Cardiovascular:     Rate and Rhythm: Normal rate and regular rhythm.     Pulses: Normal pulses.     Heart sounds: Normal heart sounds.  Pulmonary:     Effort: Pulmonary effort is normal.     Breath sounds: Normal breath sounds.  Abdominal:     General: Bowel sounds are normal.     Palpations: Abdomen is soft.  Musculoskeletal:        General: Normal range of motion.     Cervical back: Normal range of motion and neck supple.     Right lower leg: No edema.     Left lower leg: No edema.  Skin:    General: Skin is warm and dry.      Comments: Plethoric facies. Scratch marks present at cheeks.   Neurological:     Mental Status: She is alert and oriented to person, place, and time. Mental status is at baseline.     Cranial Nerves: No cranial nerve deficit.  Psychiatric:        Mood and Affect: Mood normal.        Behavior: Behavior normal.      Labs on Admission: I have personally reviewed following labs and imaging studies  CBC: Recent Labs  Lab 07/18/22 1143  WBC 4.5  HGB 13.1  HCT 37.8  MCV 82.9  PLT 123XX123   Basic Metabolic Panel: Recent Labs  Lab 07/18/22 1143  NA 125*  K 4.7  CL 92*  CO2 23  GLUCOSE 781*  BUN 18  CREATININE 1.20*  CALCIUM 9.1  MG 1.8   GFR: Estimated Creatinine Clearance: 36.2 mL/min (A) (by C-G formula based on SCr of 1.2 mg/dL (H)). Liver Function Tests: Recent Labs  Lab 07/18/22 1143  AST 25  ALT 14  ALKPHOS 85  BILITOT 0.7  PROT 6.8  ALBUMIN 3.5   No results for input(s): "LIPASE", "AMYLASE" in the last 168 hours. No results for input(s): "AMMONIA" in the last 168 hours. Coagulation Profile: No results for input(s): "INR", "PROTIME" in the last 168 hours. Cardiac Enzymes: No results for input(s): "CKTOTAL", "CKMB", "CKMBINDEX", "TROPONINI" in the last 168 hours. BNP (last 3 results) No results for input(s): "PROBNP" in the last 8760 hours. HbA1C: No results for input(s): "HGBA1C" in the last 72 hours. CBG: Recent Labs  Lab 07/18/22 1145 07/18/22 1528 07/18/22 1626 07/18/22 1703 07/18/22 1739  GLUCAP >600* >600* 464* 441* 306*   Lipid Profile: No results for input(s): "CHOL", "HDL", "LDLCALC", "TRIG", "CHOLHDL", "LDLDIRECT" in the last 72 hours. Thyroid Function Tests: No results for input(s): "TSH", "T4TOTAL", "FREET4", "T3FREE", "THYROIDAB" in the last 72 hours. Anemia Panel: No results for input(s): "VITAMINB12", "FOLATE", "FERRITIN", "TIBC", "IRON", "RETICCTPCT" in the last 72 hours. Urine analysis:    Component Value Date/Time    COLORURINE STRAW (A) 07/18/2022 1218   APPEARANCEUR CLEAR 07/18/2022 1218   LABSPEC 1.024 07/18/2022 1218   PHURINE 6.0 07/18/2022 1218   GLUCOSEU >=500 (A) 07/18/2022 [place] NEGATIVE 07/18/2022 [place] 07/18/2022 1218   KETONESUR 5 (A) 07/18/2022 1218   PROTEINUR NEGATIVE 07/18/2022 1218   NITRITE NEGATIVE 07/18/2022  [place] (A) 07/18/2022 1218    Radiological Exams on Admission: I have personally reviewed images DG Chest Portable 1 View  Addendum Date: 07/18/2022   ADDENDUM REPORT: 07/18/2022 13:25 ADDENDUM: Speech recognition error. Density projected over the left upper chest relates to the first rib end. Electronically Signed   By: Nelson Chimes M.D.   On: 07/18/2022 13:25   Result Date: 07/18/2022 CLINICAL DATA:  Dizziness and weakness. History of congestive heart failure. EXAM: PORTABLE CHEST 1 VIEW COMPARISON:  01/29/2020 FINDINGS: Heart size is normal. Chronic aortic atherosclerosis is noted. There is no evidence of heart failure presently. No interstitial or alveolar edema. No pleural effusion. Density projected over the left upper chest relates to the first rib in. No acute bone finding. IMPRESSION: No active disease. Aortic atherosclerosis. No evidence of heart failure. Density projected over the left upper chest relates to the first rib in. Electronically Signed: By: Nelson Chimes M.D. On: 07/18/2022 13:18    EKG: I have personally reviewed EKG: NSR LAE  Assessment/Plan Active Problems:   Type 2 diabetes mellitus with hyperosmolar hyperglycemic state (HHS) ([place])    Assessment and Plan: Type 2 diabetes mellitus with hyperosmolar hyperglycemic state (HHS) ([place]) Patient with DM2 HHS on insulin infusion. Glucose has come down from 781 to 306.   Plan Continue hyperglycemia protocol  Resume lantus when serum glucose is corrected.        DVT prophylaxis: Lovenox Code Status: Full Code Family Communication: daughter present during  interview and exam. Understands dx and tx plan  Disposition Plan: home 24-48 hrs  Consults called: none  Admission status: Observation, Med-Surg   Adella Hare, MD [facility] 07/18/2022, 6:10 PM

## 2022-07-18 NOTE — ED Notes (Signed)
Secretary notified to page MD

## 2022-07-18 NOTE — ED Provider Notes (Signed)
[facility] Provider Note   CSN: [id number] Arrival date & time: 07/18/22  1131     History  Chief Complaint  Patient presents with   Hyperglycemia    Jillian Travis is a 72 y.o. female.  With PMH of DM, HLD who presents with hyperglycemia and generalized fatigue.  Of note this was a difficult encounter as patient does speak some English but is [place] speaking at baseline and has very bad hearing and cannot hear the interpreter used.  Per patient and patient's boyfriend at bedside, she has known diabetes and is on metformin and unsure if she is on insulin but denies giving herself any shots and has been out of her medicine since December/January.  She has been drinking a lot more than usual and peeing a lot more than usual and having generalized fatigue.  No localizing weakness.  No fevers, no chills, no chest pain, no shortness of breath, no vomiting or diarrhea.  She does note some mild burning with urination.  She finally went to her doctor for follow-up who sent for blood work and then called her today about her high blood sugars and told her to go to the ER immediately.   Hyperglycemia      Home Medications Prior to Admission medications   Medication Sig Start Date End Date Taking? Authorizing Provider  blood glucose meter kit and supplies KIT Dispense based on patient and insurance preference. Use up to four times daily as directed. (FOR ICD-9 250.00, 250.01). 02/02/20   Debbe Odea, MD  insulin aspart (NOVOLOG) 100 UNIT/ML injection Inject 5 Units into the skin 3 (three) times daily with meals. 02/02/20   Debbe Odea, MD  insulin aspart (NOVOLOG) 100 UNIT/ML injection Inject 0-5 Units into the skin at bedtime. CBG 70 - 120: 0 units  CBG 121 - 150: 0 units  CBG 151 - 200: 0 units  CBG 201 - 250: 2 units  CBG 251 - 300: 3 units  CBG 301 - 350: 4 units  CBG 351 - 400: 5 units 02/02/20   Debbe Odea, MD  insulin glargine (LANTUS)  100 UNIT/ML injection Inject 0.15 mLs (15 Units total) into the skin at bedtime. 02/02/20   Debbe Odea, MD      Allergies    Other    Review of Systems   Review of Systems  Physical Exam Updated Vital Signs BP (!) 172/74   Pulse 71   Temp 98.3 F (36.8 C) (Oral)   Resp 17   Ht 5' 5"$  (1.651 m)   Wt 54.9 kg   SpO2 100%   BMI 20.14 kg/m  Physical Exam Constitutional: Alert and oriented.  No acute distress, chronically ill appearing at baseline Eyes: Conjunctivae are normal. ENT      Head: Normocephalic and atraumatic.      Nose: No congestion.      Mouth/Throat: Mucous membranes are dry.      Neck: No stridor. Cardiovascular: S1, S2,  Normal and symmetric distal pulses are present in all extremities.Warm and well perfused. Respiratory: Normal respiratory effort. Breath sounds are normal.  O2 sat 100 on RA Gastrointestinal: Soft and nontender. There is no CVA tenderness. Musculoskeletal: Normal range of motion in all extremities. No pitting edema of lower extremities Neurologic: Normal speech and language.  No facial droop.  Moving all extremities equally.  Sensation grossly intact.  No gross focal neurologic deficits are appreciated. Skin: Skin is warm, dry and intact. No  rash noted. Psychiatric: Mood and affect are normal. Speech and behavior are normal.  ED Results / Procedures / Treatments   Labs (all labs ordered are listed, but only abnormal results are displayed) Labs Reviewed  BASIC METABOLIC PANEL - Abnormal; Notable for the following components:      Result Value   Sodium 125 (*)    Chloride 92 (*)    Glucose, Bld 781 (*)    Creatinine, Ser 1.20 (*)    GFR, Estimated 48 (*)    All other components within normal limits  URINALYSIS, ROUTINE W REFLEX MICROSCOPIC - Abnormal; Notable for the following components:   Color, Urine STRAW (*)    Glucose, UA >=500 (*)    Ketones, ur 5 (*)    Leukocytes,Ua SMALL (*)    Bacteria, UA RARE (*)    All other components  within normal limits  HEPATIC FUNCTION PANEL - Abnormal; Notable for the following components:   Bilirubin, Direct 0.3 (*)    All other components within normal limits  BLOOD GAS, VENOUS - Abnormal; Notable for the following components:   pO2, Ven <31 (*)    Bicarbonate 31.1 (*)    Acid-Base Excess 4.2 (*)    All other components within normal limits  BETA-HYDROXYBUTYRIC ACID - Abnormal; Notable for the following components:   Beta-Hydroxybutyric Acid 0.65 (*)    All other components within normal limits  CBG MONITORING, ED - Abnormal; Notable for the following components:   Glucose-Capillary >600 (*)    All other components within normal limits  CBG MONITORING, ED - Abnormal; Notable for the following components:   Glucose-Capillary >600 (*)    All other components within normal limits  RESP PANEL BY RT-PCR (RSV, FLU A&B, COVID)  RVPGX2  CBC  MAGNESIUM  BRAIN NATRIURETIC PEPTIDE  BASIC METABOLIC PANEL  BASIC METABOLIC PANEL  BASIC METABOLIC PANEL  BASIC METABOLIC PANEL  OSMOLALITY  BETA-HYDROXYBUTYRIC ACID  CBG MONITORING, ED  CBG MONITORING, ED  CBG MONITORING, ED    EKG EKG Interpretation  Date/Time:  Tuesday July 18 2022 13:44:56 EST Ventricular Rate:  76 PR Interval:  165 QRS Duration: 90 QT Interval:  403 QTC Calculation: 454 R Axis:   14 Text Interpretation: Sinus rhythm Probable left atrial enlargement Confirmed by Georgina Snell [phone]) on 07/18/2022 1:46:23 PM  Radiology DG Chest Portable 1 View  Addendum Date: 07/18/2022   ADDENDUM REPORT: 07/18/2022 13:25 ADDENDUM: Speech recognition error. Density projected over the left upper chest relates to the first rib end. Electronically Signed   By: Nelson Chimes M.D.   On: 07/18/2022 13:25   Result Date: 07/18/2022 CLINICAL DATA:  Dizziness and weakness. History of congestive heart failure. EXAM: PORTABLE CHEST 1 VIEW COMPARISON:  01/29/2020 FINDINGS: Heart size is normal. Chronic aortic atherosclerosis is  noted. There is no evidence of heart failure presently. No interstitial or alveolar edema. No pleural effusion. Density projected over the left upper chest relates to the first rib in. No acute bone finding. IMPRESSION: No active disease. Aortic atherosclerosis. No evidence of heart failure. Density projected over the left upper chest relates to the first rib in. Electronically Signed: By: Nelson Chimes M.D. On: 07/18/2022 13:18    Procedures .Critical Care  Performed by: Elgie Congo, MD Authorized by: Elgie Congo, MD   Critical care provider statement:    Critical care time (minutes):  50   Critical care was necessary to treat or prevent imminent or life-threatening deterioration of the following conditions:  Metabolic crisis   Critical care was time spent personally by me on the following activities:  Development of treatment plan with patient or surrogate, discussions with consultants, evaluation of patient's response to treatment, examination of patient, ordering and review of laboratory studies, ordering and review of radiographic studies, ordering and performing treatments and interventions, pulse oximetry, re-evaluation of patient's condition, review of old charts and obtaining history from patient or surrogate   Care discussed with: admitting provider       Medications Ordered in ED Medications  insulin regular, human (MYXREDLIN) 100 units/ 100 mL infusion (has no administration in time range)  lactated ringers infusion ( Intravenous New Bag/Given 07/18/22 1525)  dextrose 5 % in lactated ringers infusion (has no administration in time range)  dextrose 50 % solution 0-50 mL (has no administration in time range)  potassium chloride 10 mEq in 100 mL IVPB (10 mEq Intravenous New Bag/Given 07/18/22 1526)  sodium chloride 0.9 % bolus 1,000 mL (0 mLs Intravenous Stopped 07/18/22 1456)  sodium chloride 0.9 % bolus 1,000 mL (0 mLs Intravenous Stopped 07/18/22 1524)    ED Course/  Medical Decision Making/ A&P   {                            Medical Decision Making [place] is a 72 y.o. female.  With PMH of DM, HLD who presents with hyperglycemia and generalized fatigue.  Of note this was a difficult encounter as patient does speak some English but is [place] speaking at baseline and has very bad hearing and cannot hear the interpreter used.  Patient's initial CBG greater than 600, presentation concerning for possible DKA versus HHS.  Inciting factor likely due to medication noncompliance.  No evidence of acute infection, no UTI with only small leukocyte esterase rare bacteria only 0-5 WBCs holding off from treating at this time.  Chest x-ray with no evidence of pneumonia reviewed by me personally.  Labs reviewed by me, concern for HHS.  Glucose 781 with pseudohyponatremia 125.  Creatinine elevated 1.2.  Bicarbonate 23 within normal limits with no anion gap and VBG with pH 7.36.  Potassium was 4.7.  Started patient on 2 L IV fluids as well as Endo tool for HHS.  Ordered for background IV potassium repletion.  Discussed case with hospitalist for admission for continued management of HHS.  Amount and/or Complexity of Data Reviewed Labs: ordered. Radiology: ordered.  Risk Prescription drug management. Decision regarding hospitalization.    Final Clinical Impression(s) / ED Diagnoses Final diagnoses:  Hyperosmolar hyperglycemic state (HHS) [facility])    Rx / DC Orders ED Discharge Orders     None         Elgie Congo, MD 07/18/22 1538

## 2022-07-18 NOTE — Subjective & Objective (Signed)
[name], a 72 y/o [place] woman with little english language skills has a h/o DM supposedly on Lantus qHS. She was seen by her primary care team Monday and had lab work which revealed severe hyper glycemia and she was referred to [facility] for evaluation.

## 2022-07-19 ENCOUNTER — Other Ambulatory Visit: Payer: Self-pay

## 2022-07-19 ENCOUNTER — Telehealth (HOSPITAL_COMMUNITY): Payer: Self-pay | Admitting: Pharmacy Technician

## 2022-07-19 ENCOUNTER — Other Ambulatory Visit (HOSPITAL_COMMUNITY): Payer: Self-pay

## 2022-07-19 DIAGNOSIS — E1100 Type 2 diabetes mellitus with hyperosmolarity without nonketotic hyperglycemic-hyperosmolar coma (NKHHC): Secondary | ICD-10-CM | POA: Diagnosis not present

## 2022-07-19 LAB — BASIC METABOLIC PANEL
Anion gap: 8 (ref 5–15)
Anion gap: 6 (ref 5–15)
Anion gap: 9 (ref 5–15)
BUN: 10 mg/dL (ref 8–23)
BUN: 10 mg/dL (ref 8–23)
BUN: 10 mg/dL (ref 8–23)
CO2: 28 mmol/L (ref 22–32)
CO2: 24 mmol/L (ref 22–32)
CO2: 22 mmol/L (ref 22–32)
Calcium: 8.8 mg/dL — ABNORMAL LOW (ref 8.9–10.3)
Calcium: 8.4 mg/dL — ABNORMAL LOW (ref 8.9–10.3)
Calcium: 8.4 mg/dL — ABNORMAL LOW (ref 8.9–10.3)
Chloride: 102 mmol/L (ref 98–111)
Chloride: 105 mmol/L (ref 98–111)
Chloride: 105 mmol/L (ref 98–111)
Creatinine, Ser: 0.71 mg/dL (ref 0.44–1.00)
Creatinine, Ser: 0.79 mg/dL (ref 0.44–1.00)
Creatinine, Ser: 0.76 mg/dL (ref 0.44–1.00)
GFR, Estimated: >60 mL/min (ref >60)
GFR, Estimated: >60 mL/min (ref >60)
GFR, Estimated: >60 mL/min (ref >60)
Glucose, Bld: 120 mg/dL — ABNORMAL HIGH (ref 70–99)
Glucose, Bld: 302 mg/dL — ABNORMAL HIGH (ref 70–99)
Glucose, Bld: 322 mg/dL — ABNORMAL HIGH (ref 70–99)
Potassium: 3.2 mmol/L — ABNORMAL LOW (ref 3.5–5.1)
Potassium: 3.4 mmol/L — ABNORMAL LOW (ref 3.5–5.1)
Potassium: 3.7 mmol/L (ref 3.5–5.1)
Sodium: 138 mmol/L (ref 135–145)
Sodium: 135 mmol/L (ref 135–145)
Sodium: 136 mmol/L (ref 135–145)

## 2022-07-19 LAB — GLUCOSE, CAPILLARY
Glucose-Capillary: 258 mg/dL — ABNORMAL HIGH (ref 70–99)
Glucose-Capillary: 279 mg/dL — ABNORMAL HIGH (ref 70–99)
Glucose-Capillary: 157 mg/dL — ABNORMAL HIGH (ref 70–99)
Glucose-Capillary: 181 mg/dL — ABNORMAL HIGH (ref 70–99)
Glucose-Capillary: 136 mg/dL — ABNORMAL HIGH (ref 70–99)
Glucose-Capillary: 213 mg/dL — ABNORMAL HIGH (ref 70–99)

## 2022-07-19 LAB — CBG MONITORING, ED: Glucose-Capillary: 142 mg/dL — ABNORMAL HIGH (ref 70–99)

## 2022-07-19 LAB — BETA-HYDROXYBUTYRIC ACID: Beta-Hydroxybutyric Acid: 0.3 mmol/L — ABNORMAL HIGH (ref 0.05–0.27)

## 2022-07-19 MED ORDER — INSULIN ASPART 100 UNIT/ML IJ SOLN
0.0000 [IU] | Freq: Every day | INTRAMUSCULAR | Status: DC
  Administered 2022-07-19: 2 [IU] via SUBCUTANEOUS

## 2022-07-19 MED ORDER — INSULIN ASPART 100 UNIT/ML IJ SOLN: 0.0000 [IU] | Freq: Three times a day (TID) | INTRAMUSCULAR | Status: DC

## 2022-07-19 MED ORDER — INSULIN ASPART 100 UNIT/ML IJ SOLN: 0.0000 [IU] | Freq: Every day | INTRAMUSCULAR | Status: DC

## 2022-07-19 MED ORDER — LIVING WELL WITH DIABETES BOOK
Freq: Once | Status: DC
  Filled 2022-07-19: qty 1

## 2022-07-19 MED ORDER — INSULIN ASPART 100 UNIT/ML IJ SOLN
0.0000 [IU] | Freq: Three times a day (TID) | INTRAMUSCULAR | Status: DC
  Administered 2022-07-19: 3 [IU] via SUBCUTANEOUS
  Administered 2022-07-19: 2 [IU] via SUBCUTANEOUS
  Administered 2022-07-19: 1 [IU] via SUBCUTANEOUS
  Administered 2022-07-20: 2 [IU] via SUBCUTANEOUS

## 2022-07-19 MED ORDER — INSULIN GLARGINE-YFGN 100 UNIT/ML ~~LOC~~ SOLN
15.0000 [IU] | Freq: Every day | SUBCUTANEOUS | Status: DC
  Administered 2022-07-19 (×2): 15 [IU] via SUBCUTANEOUS
  Filled 2022-07-19 (×3): qty 0.15

## 2022-07-19 MED ORDER — INSULIN STARTER KIT- PEN NEEDLES (ENGLISH)
1.0000 | Freq: Once | Status: DC
  Filled 2022-07-19: qty 1

## 2022-07-19 NOTE — Hospital Course (Signed)
PMH of type II DM, HLD present to the hospital with complaints of polyuria polydipsia.  Found to have CBG more than 600.  Treated with IV insulin.

## 2022-07-19 NOTE — Progress Notes (Signed)
[facility] Progress Note Patient: Jillian Travis [id number] DOB: 10-01-1950 DOA: 07/18/2022  DOS: the patient was seen and examined on 07/19/2022  Brief hospital course: PMH of type II DM, HLD present to the hospital with complaints of polyuria polydipsia.  Found to have CBG more than 600.  Treated with IV insulin.   Assessment and Plan: Type 2 diabetes mellitus, uncontrolled with hyperglycemia. Presentation with hyperosmolar hyperglycemic state. Treated with IV fluids and IV insulin. No anion gap. Currently improving sugar level. On basal bolus regimen right now. Has not used any insulin therapy despite prescription in 2021. Will educate the patient for insulin therapy. Significant difficulty with diet given her need for interpreter.  Hemoglobin A1c last time checked was more than detectable level. No indication for close retinopathy Tolerating oral diet. Monitor.  Hard of hearing. Monitor.   Subjective: No nausea no vomiting no fever no chills.  Physical Exam: General: in Mild distress, No Rash Cardiovascular: S1 and S2 Present, No Murmur Respiratory: Good respiratory effort, Bilateral Air entry present. No Crackles, No wheezes Abdomen: Bowel Sound present, No tenderness Extremities: No edema Neuro: Alert and oriented x3, no new focal deficit  Data Reviewed: I have Reviewed nursing notes, Vitals, and Lab results. Since last encounter, pertinent lab results CBC and BMP   . I have ordered test including CBC and BMP  .   Disposition: Status is: Observation  enoxaparin (LOVENOX) injection 40 mg Start: 07/18/22 2200   Family Communication: No one at bedside Level of care: Med-Surg  Vitals:   07/19/22 0153 07/19/22 0302 07/19/22 0412 07/19/22 1711  BP: (!) 155/59  (!) 155/66 (!) 151/71  Pulse: 61  72 66  Resp: 16  18 18  $ Temp:   98 F (36.7 C) 98.1 F (36.7 C)  TempSrc:   Oral Oral  SpO2: 98%  100% 99%  Weight:  52 kg    Height:  5' 5"$  (1.651 m)        Author: Berle Mull, MD 07/19/2022 7:16 PM  Please look on [email or web address] to find out who is on call.

## 2022-07-19 NOTE — Inpatient Diabetes Management (Signed)
Inpatient Diabetes Program Recommendations  AACE/ADA: New Consensus Statement on Inpatient Glycemic Control (2015)  Target Ranges:  Prepandial:   less than 140 mg/dL      Peak postprandial:   less than 180 mg/dL (1-2 hours)      Critically ill patients:  140 - 180 mg/dL   Lab Results  Component Value Date   GLUCAP 181 (H) 07/19/2022   HGBA1C >15.5 (H) 01/30/2020    Review of Glycemic Control  Latest Reference Range & Units 07/19/22 07:49 07/19/22 07:53 07/19/22 11:52 07/19/22 12:08  Glucose-Capillary 70 - 99 mg/dL 258 (H) 279 (H) 157 (H) 181 (H)  (H): Data is abnormally high  Diabetes history: DM2 Outpatient Diabetes medications: None Current orders for Inpatient glycemic control: Semglee 15 units QD, Novolog 0-9 units TID and 0-5 units QHS  Referral received for DM education and insulin teaching.  Spoke with patient & daughter [name] at bedside.  Attempted to use the Status for interpreting but she was unable to hear the interpreter.  [facility] interpreter and I could not get through with them.   Patient was inpatient in 2021 and was discharged on insulin.  Patient states she did not know she was supposed to be taking insulin.    Educated patient's daughter on insulin pen use at home. Reviewed contents of insulin flexpen starter kit. Reviewed all steps of insulin pen including attachment of needle, 2-unit air shot, dialing up dose, giving injection, removing needle, disposal of sharps, storage of unused insulin, disposal of insulin etc. Patient able to provide successful return demonstration. Also reviewed troubleshooting with insulin pen. MD to give patient Rxs for insulin pens and insulin pen needles.  Daughter downloaded the You Tube video from the [facility] demonstrating insulin pen administration.    Asked for benefit check through pharmacy.  Lantus and Humalog are $0 co-pay.  They are interested in a CGM if covered by insurance.  Asked for prior authorization to be initiated.     She should check her BG ac/hs.  She should bring her meter with her to her PCP follow up appointment for review.  Discussed hypoglycemia, signs, symptoms and treatments.    She does not drink any caloric beverages.  Educated on The Plate Method, CHO's, portion control, F/U with PCP every 3 months, bring meter to PCP office, long and short term complications of uncontrolled BG, and importance of exercise.  Will follow up with patient and family tomorrow and make recommendations for DC.    Will continue to follow while inpatient.  Thank you, Reche Dixon, MSN, [place] Diabetes Coordinator Inpatient Diabetes Program [phone] (team pager from 8a-5p)

## 2022-07-19 NOTE — TOC Initial Note (Signed)
Transition of Care [facility] - Initial/Assessment Note    Patient Details  Name: Jillian Travis MRN: [id number] Date of Birth: 20-Jun-1950  Transition of Care [facility]) CM/SW Contact:    Jillian Travis, Jillian Ee, RN Phone Number: 07/19/2022, 4:13 PM  Clinical Narrative:                  CM spoke with patient and daughter, Jillian Travis at bedside about needs for post hospital transition.  Admitted for Hyperglycemia. Diabetic Coordinator was also in room during assessment. Jillian Travis was able to translate as patient could not hear well from [place]. Patient lives with her husband and son. Independent with care and drives prior to admission. States she did not use insulin prior to admission, did not know she was supposed to. Does not have DME's at home. Diabetic Coordinator educated patient and benefit check was done on insulin by [facility]. Patient has both [facility] medicare and Medicaid and insulin should be covered.  PCP is Leonard Downing, MD and uses [facility]. Patient states she will f/u with PCP and comply with medication regimen.  CM will continue to follow as patient progresses with care towards discharge.                Expected Discharge Plan: Home/Self Care Barriers to Discharge: Continued Medical Work up   Patient Goals and CMS Choice Patient states their goals for this hospitalization and ongoing recovery are:: To return home CMS Medicare.gov Compare Post Acute Care list provided to:: Patient Choice offered to / list presented to : Patient, Adult Children      Expected Discharge Plan and Services   Discharge Planning Services: CM Consult Post Acute Care Choice: NA Living arrangements for the past 2 months: Single Family Home                 DME Arranged: N/A DME Agency: NA       HH Arranged: NA HH Agency: NA        Prior Living Arrangements/Services Living arrangements for the past 2 months: Single Family Home Lives with:: Spouse, Adult Children  (Son) Patient language and need for interpreter reviewed:: Yes Do you feel safe going back to the place where you live?: Yes      Need for Family Participation in Patient Care: Yes (Comment) Care giver support system in place?: Yes (comment)   Criminal Activity/Legal Involvement Pertinent to Current Situation/Hospitalization: No - Comment as needed  Activities of Daily Living Home Assistive Devices/Equipment: None ADL Screening (condition at time of admission) Patient's cognitive ability adequate to safely complete daily activities?: Yes Is the patient deaf or have difficulty hearing?: No Does the patient have difficulty seeing, even when wearing glasses/contacts?: No Does the patient have difficulty concentrating, remembering, or making decisions?: No Patient able to express need for assistance with ADLs?: Yes Does the patient have difficulty dressing or bathing?: No Independently performs ADLs?: Yes (appropriate for developmental age) Does the patient have difficulty walking or climbing stairs?: No Weakness of Legs: None Weakness of Arms/Hands: None  Permission Sought/Granted Permission sought to share information with : Case Manager, Family Supports Permission granted to share information with : Yes, Verbal Permission Granted              Emotional Assessment Appearance:: Appears stated age Attitude/Demeanor/Rapport: Engaged, Gracious Affect (typically observed): Accepting, Appropriate, Calm, Hopeful, Pleasant Orientation: : Oriented to Self, Oriented to Place, Oriented to  Time, Oriented to Situation Alcohol / Substance Use: Not  Applicable Psych Involvement: No (comment)  Admission diagnosis:  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 coli) ([place]) 02/02/2020   DKA (diabetic ketoacidoses) 01/30/2020   Acute renal failure (ARF) ([place]) 01/30/2020   Generalized weakness 01/30/2020   Thrombocytopenia ([place]) 01/30/2020   PCP:  Leonard Downing, MD Pharmacy:   [facility] [place], [place] [address] Phone: [phone] Fax: [phone]     Social Determinants of Health (SDOH) Social History: SDOH Screenings   Tobacco Use: Low Risk  (07/18/2022)   SDOH Interventions: Transportation Interventions: Intervention Not Indicated, Inpatient TOC, Patient Resources (Friends/Family)   Readmission Risk Interventions     No data to display

## 2022-07-19 NOTE — Telephone Encounter (Signed)
Patient Advocate Encounter   Received notification that prior authorization for FreeStyle Libre 3 Sensor is required.   PA submitted on 07/19/2022 Key [id number] Status is pending       Lyndel Safe, [place] Patient Advocate Specialist [place] Patient Advocate Team Direct Number: [phone]  Fax: [phone]

## 2022-07-19 NOTE — Plan of Care (Signed)
  Problem: Education: Goal: Knowledge of General Education information will improve Description Including pain rating scale, medication(s)/side effects and non-pharmacologic comfort measures Outcome: Progressing   

## 2022-07-19 NOTE — ED Notes (Signed)
ED TO INPATIENT HANDOFF REPORT  ED Nurse Name and Phone #: [phone]  S Name/Age/Gender Jillian Travis 72 y.o. female Room/Bed: 042C/042C  Code Status   Code Status: Full Code  Home/SNF/Other Home Patient oriented to: self, place, time, and situation Is this baseline? Yes   Triage Complete: Triage complete  Chief Complaint Type 2 diabetes mellitus with hyperosmolar nonketotic hyperglycemia ([place]) [E11.00]  Triage Note Went to PCP yesterday for check up and PCP called this morning and told her that her blood draw showed her CBG >600.  Complains of dizziness.  Denies n/v abd pain or headache.    Allergies No Known Allergies  Level of Care/Admitting Diagnosis ED Disposition     ED Disposition  Admit   Condition  --   Comment  Hospital Area: [place] [[id number]]  Level of Care: Med-Surg [16]  May place patient in observation at [facility] or [place] if equivalent level of care is available:: Yes  Covid Evaluation: Asymptomatic - no recent exposure (last 10 days) testing not required  Diagnosis: Type 2 diabetes mellitus with hyperosmolar nonketotic hyperglycemia ([place]) [id number]  Admitting Physician: Neena Rhymes [[id number]]  Attending Physician: Neena Rhymes [[id number]]          B Medical/Surgery History Past Medical History:  Diagnosis Date   Diabetes ([place])    HLD (hyperlipidemia)    Type 2 diabetes mellitus with hyperosmolar hyperglycemic state (HHS) ([place])    History reviewed. No pertinent surgical history.   A IV Location/Drains/Wounds Patient Lines/Drains/Airways Status     Active Line/Drains/Airways     Name Placement date Placement time Site Days   Peripheral IV 07/18/22 20 G Right Antecubital 07/18/22  1327  Antecubital  1   Peripheral IV 07/18/22 22 G Anterior;Right Forearm 07/18/22  1456  Forearm  1   External Urinary Catheter 01/30/20  2322  --  901            Intake/Output Last 24 hours  Intake/Output Summary (Last 24  hours) at 07/19/2022 0150 Last data filed at 07/19/2022 [id number] Gross per 24 hour  Intake 1604.89 ml  Output --  Net 1604.89 ml    Labs/Imaging Results for orders placed or performed during the hospital encounter of 07/18/22 (from the past 48 hour(s))  Basic metabolic panel     Status: Abnormal   Collection Time: 07/18/22 11:43 AM  Result Value Ref Range   Sodium 125 (L) 135 - 145 mmol/L   Potassium 4.7 3.5 - 5.1 mmol/L    Comment: HEMOLYSIS AT THIS LEVEL MAY AFFECT RESULT   Chloride 92 (L) 98 - 111 mmol/L   CO2 23 22 - 32 mmol/L   Glucose, Bld 781 (HH) 70 - 99 mg/dL    Comment: CRITICAL RESULT CALLED TO, READ BACK BY AND VERIFIED WITH Ivonne Andrew, RN 1253 07/18/22 L. KLAR Glucose reference range applies only to samples taken after fasting for at least 8 hours.    BUN 18 8 - 23 mg/dL   Creatinine, Ser 1.20 (H) 0.44 - 1.00 mg/dL   Calcium 9.1 8.9 - 10.3 mg/dL   GFR, Estimated 48 (L) >60 mL/min    Comment: (NOTE) Calculated using the CKD-EPI Creatinine Equation (2021)    Anion gap 10 5 - 15    Comment: Performed at [facility] [address], [place], [place] [address]  CBC     Status: None   Collection Time: 07/18/22 11:43 AM  Result Value Ref Range  WBC 4.5 4.0 - 10.5 K/uL   RBC 4.56 3.87 - 5.11 MIL/uL   Hemoglobin 13.1 12.0 - 15.0 g/dL   HCT 37.8 36.0 - 46.0 %   MCV 82.9 80.0 - 100.0 fL   MCH 28.7 26.0 - 34.0 pg   MCHC 34.7 30.0 - 36.0 g/dL   RDW 11.9 11.5 - 15.5 %   Platelets 199 150 - 400 K/uL   nRBC 0.0 0.0 - 0.2 %    Comment: Performed at [facility], [place] [address] [place], [place] [address]  Hepatic function panel     Status: Abnormal   Collection Time: 07/18/22 11:43 AM  Result Value Ref Range   Total Protein 6.8 6.5 - 8.1 g/dL   Albumin 3.5 3.5 - 5.0 g/dL   AST 25 15 - 41 U/L    Comment: HEMOLYSIS AT THIS LEVEL MAY AFFECT RESULT   ALT 14 0 - 44 U/L    Comment: HEMOLYSIS AT THIS LEVEL MAY AFFECT RESULT   Alkaline Phosphatase 85 38 - 126 U/L    Total Bilirubin 0.7 0.3 - 1.2 mg/dL    Comment: HEMOLYSIS AT THIS LEVEL MAY AFFECT RESULT   Bilirubin, Direct 0.3 (H) 0.0 - 0.2 mg/dL   Indirect Bilirubin 0.4 0.3 - 0.9 mg/dL    Comment: Performed at [facility] [address], [place], [place] [address]  Magnesium     Status: None   Collection Time: 07/18/22 11:43 AM  Result Value Ref Range   Magnesium 1.8 1.7 - 2.4 mg/dL    Comment: Performed at [facility] [address] [place], [place] [address]  Brain natriuretic peptide     Status: None   Collection Time: 07/18/22 11:43 AM  Result Value Ref Range   B Natriuretic Peptide 78.1 0.0 - 100.0 pg/mL    Comment: Performed at [facility] [address] [place], [place] [address]  CBG monitoring, ED     Status: Abnormal   Collection Time: 07/18/22 11:45 AM  Result Value Ref Range   Glucose-Capillary >600 (HH) 70 - 99 mg/dL    Comment: Glucose reference range applies only to samples taken after fasting for at least 8 hours.  Urinalysis, Routine w reflex microscopic -Urine, Clean Catch     Status: Abnormal   Collection Time: 07/18/22 12:18 PM  Result Value Ref Range   Color, Urine STRAW (A) YELLOW   APPearance CLEAR CLEAR   Specific Gravity, Urine 1.024 1.005 - 1.030   pH 6.0 5.0 - 8.0   Glucose, UA >=500 (A) NEGATIVE mg/dL   Hgb urine dipstick NEGATIVE NEGATIVE   Bilirubin Urine NEGATIVE NEGATIVE   Ketones, ur 5 (A) NEGATIVE mg/dL   Protein, ur NEGATIVE NEGATIVE mg/dL   Nitrite NEGATIVE NEGATIVE   Leukocytes,Ua SMALL (A) NEGATIVE   RBC / HPF 0-5 0 - 5 RBC/hpf   WBC, UA 0-5 0 - 5 WBC/hpf   Bacteria, UA RARE (A) NONE SEEN   Squamous Epithelial / HPF 0-5 0 - 5 /HPF    Comment: Performed at [facility], [address], [place],  [address]  Resp panel by RT-PCR (RSV, Flu A&B, Covid) Anterior Nasal Swab     Status: None   Collection Time: 07/18/22 12:45 PM   Specimen: Anterior Nasal Swab  Result Value Ref Range   SARS Coronavirus 2 by RT PCR  NEGATIVE NEGATIVE   Influenza A by PCR NEGATIVE NEGATIVE   Influenza B by PCR  NEGATIVE NEGATIVE    Comment: (NOTE) The Xpert Xpress SARS-CoV-2/FLU/RSV plus assay is intended as an aid in the diagnosis of influenza from Nasopharyngeal swab specimens and should not be used as a sole basis for treatment. Nasal washings and aspirates are unacceptable for Xpert Xpress SARS-CoV-2/FLU/RSV testing.  Fact Sheet for Patients: EntrepreneurPulse.com.au  Fact Sheet for Healthcare Providers: IncredibleEmployment.be  This test is not yet approved or cleared by the [place] FDA and has been authorized for detection and/or diagnosis of SARS-CoV-2 by FDA under an Emergency Use Authorization (EUA). This EUA will remain in effect (meaning this test can be used) for the duration of the COVID-19 declaration under Section 564(b)(1) of the Act, 21 U.S.C. section 360bbb-3(b)(1), unless the authorization is terminated or revoked.     Resp Syncytial Virus by PCR NEGATIVE NEGATIVE    Comment: (NOTE) Fact Sheet for Patients: EntrepreneurPulse.com.au  Fact Sheet for Healthcare Providers: IncredibleEmployment.be  This test is not yet approved or cleared by the [place] FDA and has been authorized for detection and/or diagnosis of SARS-CoV-2 by FDA under an Emergency Use Authorization (EUA). This EUA will remain in effect (meaning this test can be used) for the duration of the COVID-19 declaration under Section 564(b)(1) of the Act, 21 U.S.C. section 360bbb-3(b)(1), unless the authorization is terminated or revoked.  Performed at [facility], [place] [address], [place], [place] [address]   Blood gas, venous     Status: Abnormal   Collection Time: 07/18/22 12:45 PM  Result Value Ref Range   pH, Ven 7.36 7.25 - 7.43   pCO2, Ven 55 44 - 60 mmHg   pO2, Ven <31 (LL) 32 - 45 mmHg    Comment: CRITICAL RESULT CALLED TO,  READ BACK BY AND VERIFIED WITH: NOTIFIED Abran Richard, RN @ [phone] 07/18/22 BY SEKDAHL    Bicarbonate 31.1 (H) 20.0 - 28.0 mmol/L   Acid-Base Excess 4.2 (H) 0.0 - 2.0 mmol/L   O2 Saturation 28.3 %   Patient temperature 37.0    Drawn by DRAWN BY RN     Comment: Performed at [facility] [address], [place], [place] [address]  Beta-hydroxybutyric acid     Status: Abnormal   Collection Time: 07/18/22  1:24 PM  Result Value Ref Range   Beta-Hydroxybutyric Acid 0.65 (H) 0.05 - 0.27 mmol/L    Comment: Performed at [facility] [address], [place], [place] [address]  CBG monitoring, ED     Status: Abnormal   Collection Time: 07/18/22  3:28 PM  Result Value Ref Range   Glucose-Capillary >600 (HH) 70 - 99 mg/dL    Comment: Glucose reference range applies only to samples taken after fasting for at least 8 hours.  CBG monitoring, ED     Status: Abnormal   Collection Time: 07/18/22  4:26 PM  Result Value Ref Range   Glucose-Capillary 464 (H) 70 - 99 mg/dL    Comment: Glucose reference range applies only to samples taken after fasting for at least 8 hours.  CBG monitoring, ED     Status: Abnormal   Collection Time: 07/18/22  5:03 PM  Result Value Ref Range   Glucose-Capillary 441 (H) 70 - 99 mg/dL    Comment: Glucose reference range applies only to samples taken after fasting for at least 8 hours.  CBG monitoring, ED     Status: Abnormal   Collection Time: 07/18/22  5:39 PM  Result Value Ref Range   Glucose-Capillary 306 (  H) 70 - 99 mg/dL    Comment: Glucose reference range applies only to samples taken after fasting for at least 8 hours.  Basic metabolic panel     Status: Abnormal   Collection Time: 07/18/22  6:14 PM  Result Value Ref Range   Sodium 136 135 - 145 mmol/L   Potassium 2.9 (L) 3.5 - 5.1 mmol/L    Comment: DELTA CHECK NOTED   Chloride 106 98 - 111 mmol/L   CO2 25 22 - 32 mmol/L   Glucose, Bld 217 (H) 70 - 99 mg/dL    Comment: Glucose reference range  applies only to samples taken after fasting for at least 8 hours.   BUN 10 8 - 23 mg/dL   Creatinine, Ser 0.86 0.44 - 1.00 mg/dL    Comment: DELTA CHECK NOTED   Calcium 7.8 (L) 8.9 - 10.3 mg/dL   GFR, Estimated >60 >60 mL/min    Comment: (NOTE) Calculated using the CKD-EPI Creatinine Equation (2021)    Anion gap 5 5 - 15    Comment: Performed at [facility] [address], [place], [place] [address]  CBG monitoring, ED     Status: Abnormal   Collection Time: 07/18/22  7:08 PM  Result Value Ref Range   Glucose-Capillary 165 (H) 70 - 99 mg/dL    Comment: Glucose reference range applies only to samples taken after fasting for at least 8 hours.  CBG monitoring, ED     Status: Abnormal   Collection Time: 07/18/22  8:25 PM  Result Value Ref Range   Glucose-Capillary 145 (H) 70 - 99 mg/dL    Comment: Glucose reference range applies only to samples taken after fasting for at least 8 hours.  Basic metabolic panel     Status: Abnormal   Collection Time: 07/18/22  9:24 PM  Result Value Ref Range   Sodium 136 135 - 145 mmol/L   Potassium 3.8 3.5 - 5.1 mmol/L   Chloride 105 98 - 111 mmol/L   CO2 28 22 - 32 mmol/L   Glucose, Bld 204 (H) 70 - 99 mg/dL    Comment: Glucose reference range applies only to samples taken after fasting for at least 8 hours.   BUN 10 8 - 23 mg/dL   Creatinine, Ser 0.79 0.44 - 1.00 mg/dL   Calcium 8.3 (L) 8.9 - 10.3 mg/dL   GFR, Estimated >60 >60 mL/min    Comment: (NOTE) Calculated using the CKD-EPI Creatinine Equation (2021)    Anion gap 3 (L) 5 - 15    Comment: Performed at [facility] [address], [place],  [address]  Beta-hydroxybutyric acid     Status: None   Collection Time: 07/18/22  9:24 PM  Result Value Ref Range   Beta-Hydroxybutyric Acid 0.26 0.05 - 0.27 mmol/L    Comment: Performed at [facility] [address] [place],  [address]  CBC     Status: Abnormal   Collection Time: 07/18/22  9:24 PM  Result Value  Ref Range   WBC 5.7 4.0 - 10.5 K/uL   RBC 4.28 3.87 - 5.11 MIL/uL   Hemoglobin 12.3 12.0 - 15.0 g/dL   HCT 35.8 (L) 36.0 - 46.0 %   MCV 83.6 80.0 - 100.0 fL   MCH 28.7 26.0 - 34.0 pg   MCHC 34.4 30.0 - 36.0 g/dL   RDW 11.9 11.5 - 15.5 %   Platelets 153 150 - 400 K/uL   nRBC 0.0  0.0 - 0.2 %    Comment: Performed at [facility], [place] [address], [place], [place] [address]  Osmolality     Status: Abnormal   Collection Time: 07/18/22  9:24 PM  Result Value Ref Range   Osmolality 297 (H) 275 - 295 mOsm/kg    Comment: Performed at [facility], [place] [address] [place], [place] [address]  CBG monitoring, ED     Status: Abnormal   Collection Time: 07/18/22  9:29 PM  Result Value Ref Range   Glucose-Capillary 191 (H) 70 - 99 mg/dL    Comment: Glucose reference range applies only to samples taken after fasting for at least 8 hours.  CBG monitoring, ED     Status: Abnormal   Collection Time: 07/18/22 10:37 PM  Result Value Ref Range   Glucose-Capillary 174 (H) 70 - 99 mg/dL    Comment: Glucose reference range applies only to samples taken after fasting for at least 8 hours.  CBG monitoring, ED     Status: Abnormal   Collection Time: 07/18/22 11:39 PM  Result Value Ref Range   Glucose-Capillary 157 (H) 70 - 99 mg/dL    Comment: Glucose reference range applies only to samples taken after fasting for at least 8 hours.  CBG monitoring, ED     Status: Abnormal   Collection Time: 07/19/22 12:41 AM  Result Value Ref Range   Glucose-Capillary 142 (H) 70 - 99 mg/dL    Comment: Glucose reference range applies only to samples taken after fasting for at least 8 hours.  Basic metabolic panel     Status: Abnormal   Collection Time: 07/19/22  1:00 AM  Result Value Ref Range   Sodium 138 135 - 145 mmol/L   Potassium 3.2 (L) 3.5 - 5.1 mmol/L   Chloride 102 98 - 111 mmol/L   CO2 28 22 - 32 mmol/L   Glucose, Bld 120 (H) 70 - 99 mg/dL    Comment: Glucose reference range applies only to  samples taken after fasting for at least 8 hours.   BUN 10 8 - 23 mg/dL   Creatinine, Ser 0.71 0.44 - 1.00 mg/dL   Calcium 8.8 (L) 8.9 - 10.3 mg/dL   GFR, Estimated >60 >60 mL/min    Comment: (NOTE) Calculated using the CKD-EPI Creatinine Equation (2021)    Anion gap 8 5 - 15    Comment: Performed at [facility] [address] [place], [place] [address]   DG Chest Portable 1 View  Addendum Date: 07/18/2022   ADDENDUM REPORT: 07/18/2022 13:25 ADDENDUM: Speech recognition error. Density projected over the left upper chest relates to the first rib end. Electronically Signed   By: Nelson Chimes M.D.   On: 07/18/2022 13:25   Result Date: 07/18/2022 CLINICAL DATA:  Dizziness and weakness. History of congestive heart failure. EXAM: PORTABLE CHEST 1 VIEW COMPARISON:  01/29/2020 FINDINGS: Heart size is normal. Chronic aortic atherosclerosis is noted. There is no evidence of heart failure presently. No interstitial or alveolar edema. No pleural effusion. Density projected over the left upper chest relates to the first rib in. No acute bone finding. IMPRESSION: No active disease. Aortic atherosclerosis. No evidence of heart failure. Density projected over the left upper chest relates to the first rib in. Electronically Signed: By: Nelson Chimes M.D. On: 07/18/2022 13:18    Pending Labs Unresulted Labs (From admission, onward)     Start     Ordered   07/25/22 0500  Creatinine, serum  (enoxaparin (LOVENOX)    CrCl >/= 30 ml/min)  Weekly,   R     Comments: while on enoxaparin therapy    07/18/22 1820   07/18/22 0000000  Basic metabolic panel  (Hyperglycemic Hyperosmolar State (HHS))  STAT Now then every 4 hours ,   R (with STAT occurrences)      07/18/22 1820   07/18/22 1819  Hemoglobin A1c  (Hyperglycemic Hyperosmolar State (HHS))  Add-on,   AD       Comments: To assess prior glycemic control.    07/18/22 1820   07/18/22 [id number]  Basic metabolic panel  (Hyperglycemic Hyperosmolar State (HHS))   STAT Now then every 4 hours ,   STAT      07/18/22 1324   07/18/22 1324  Osmolality  (Hyperglycemic Hyperosmolar State (HHS))  Once,   URGENT        07/18/22 1324   07/18/22 1324  Beta-hydroxybutyric acid  (Hyperglycemic Hyperosmolar State (HHS))  Now then every 8 hours,   STAT (with URGENT occurrences)      07/18/22 1324            Vitals/Pain Today's Vitals   07/18/22 2200 07/18/22 2300 07/19/22 0044 07/19/22 0130  BP: (!) 148/70 (!) 150/91    Pulse: 70 69  64  Resp: 16 18  20  $ Temp:   98.2 F (36.8 C)   TempSrc:   Oral   SpO2: 100% 100%  100%  Weight:      Height:      PainSc:        Isolation Precautions Airborne and Contact precautions  Medications Medications  enoxaparin (LOVENOX) injection 40 mg (40 mg Subcutaneous Given 07/18/22 2206)  dextrose 50 % solution 0-50 mL (has no administration in time range)  insulin glargine-yfgn (SEMGLEE) injection 15 Units (15 Units Subcutaneous Given 07/19/22 0101)  insulin aspart (novoLOG) injection 0-9 Units (has no administration in time range)  insulin aspart (novoLOG) injection 0-5 Units ( Subcutaneous Not Given 07/19/22 0102)  sodium chloride 0.9 % bolus 1,000 mL (0 mLs Intravenous Stopped 07/18/22 1456)  sodium chloride 0.9 % bolus 1,000 mL (0 mLs Intravenous Stopped 07/18/22 1524)  potassium chloride 10 mEq in 100 mL IVPB (0 mEq Intravenous Stopped 07/18/22 1828)  potassium chloride 10 mEq in 100 mL IVPB (0 mEq Intravenous Stopped 07/18/22 2159)    Mobility walks     Focused Assessments Hyperglycemia   R Recommendations: See Admitting Provider Note  Report given to:   Additional Notes: Patient speaks very minimal [place], needs [place] interpreter.

## 2022-07-19 NOTE — Care Management Obs Status (Signed)
[facility] NOTIFICATION   Patient Details  Name: Jillian Travis MRN: [id number] Date of Birth: [birth year]   Medicare Observation Status Notification Given:  Yes    Tom-Johnson, Renea Ee, RN 07/19/2022, 4:11 PM

## 2022-07-19 NOTE — Progress Notes (Signed)
New Admission Note:  Arrival Method: Stretcher Mental Orientation: Alert and oriented x 4 Telemetry: N/A Assessment: Completed Skin: Warm and dry IV: NSLs Pain: Denies Tubes: NA Safety Measures: Safety Fall Prevention Plan initiated.  Admission: Completed 5 M  Orientation: Patient has been orientated to the room, unit and the staff. Welcome booklet given.  Family: N/A  Orders have been reviewed and implemented. Will continue to monitor the patient. Call light has been placed within reach and bed alarm has been activated.   Sima Matas BSN, RN  Phone Number: [phone]

## 2022-07-20 ENCOUNTER — Other Ambulatory Visit (HOSPITAL_COMMUNITY): Payer: Self-pay

## 2022-07-20 DIAGNOSIS — E1100 Type 2 diabetes mellitus with hyperosmolarity without nonketotic hyperglycemic-hyperosmolar coma (NKHHC): Secondary | ICD-10-CM | POA: Diagnosis not present

## 2022-07-20 LAB — BASIC METABOLIC PANEL
Anion gap: 6 (ref 5–15)
BUN: 14 mg/dL (ref 8–23)
CO2: 25 mmol/L (ref 22–32)
Calcium: 9.1 mg/dL (ref 8.9–10.3)
Chloride: 107 mmol/L (ref 98–111)
Creatinine, Ser: 1.07 mg/dL — ABNORMAL HIGH (ref 0.44–1.00)
GFR, Estimated: 55 mL/min — ABNORMAL LOW (ref >60)
Glucose, Bld: 148 mg/dL — ABNORMAL HIGH (ref 70–99)
Potassium: 4 mmol/L (ref 3.5–5.1)
Sodium: 138 mmol/L (ref 135–145)

## 2022-07-20 LAB — GLUCOSE, CAPILLARY: Glucose-Capillary: 185 mg/dL — ABNORMAL HIGH (ref 70–99)

## 2022-07-20 LAB — HEMOGLOBIN A1C
Hgb A1c MFr Bld: >15.5 % — ABNORMAL HIGH (ref 4.8–5.6)
Mean Plasma Glucose: >398 mg/dL

## 2022-07-20 LAB — MAGNESIUM: Magnesium: 1.6 mg/dL — ABNORMAL LOW (ref 1.7–2.4)

## 2022-07-20 MED ORDER — INSULIN ASPART 100 UNIT/ML FLEXPEN: 3.0000 [IU] | PEN_INJECTOR | Freq: Three times a day (TID) | SUBCUTANEOUS | 0 refills | Status: AC

## 2022-07-20 MED ORDER — FREESTYLE LIBRE 3 SENSOR MISC: 1.0000 | Freq: Once | 0 refills | Status: AC

## 2022-07-20 MED ORDER — FREESTYLE LIBRE 2 READER DEVI: 1.0000 | Freq: Once | 0 refills | Status: AC

## 2022-07-20 MED ORDER — SIMVASTATIN 10 MG PO TABS: 10.0000 mg | ORAL_TABLET | Freq: Every day | ORAL | 0 refills | Status: AC

## 2022-07-20 MED ORDER — INSULIN GLARGINE 100 UNIT/ML SOLOSTAR PEN: 15.0000 [IU] | PEN_INJECTOR | Freq: Every day | SUBCUTANEOUS | 0 refills | Status: AC

## 2022-07-20 MED ORDER — MAGNESIUM SULFATE 2 GM/50ML IV SOLN
2.0000 g | Freq: Once | INTRAVENOUS | Status: AC
  Administered 2022-07-20: 2 g via INTRAVENOUS
  Filled 2022-07-20: qty 50

## 2022-07-20 MED ORDER — LANCETS MISC: 1.0000 | Freq: Three times a day (TID) | 0 refills | Status: AC

## 2022-07-20 MED ORDER — BLOOD GLUCOSE MONITORING SUPPL DEVI: 1.0000 | Freq: Three times a day (TID) | 0 refills | Status: AC

## 2022-07-20 MED ORDER — LANCET DEVICE MISC: 1.0000 | Freq: Three times a day (TID) | 0 refills | Status: AC

## 2022-07-20 MED ORDER — BLOOD GLUCOSE TEST VI STRP: 1.0000 | ORAL_STRIP | Freq: Three times a day (TID) | 0 refills | Status: AC

## 2022-07-20 MED ORDER — PEN NEEDLES 31G X 5 MM MISC: 1.0000 | Freq: Three times a day (TID) | 0 refills | Status: AC

## 2022-07-20 MED ORDER — GLUCAGON EMERGENCY 1 MG IJ KIT: 1.0000 mg | PACK | Freq: Once | INTRAMUSCULAR | 0 refills | Status: AC | PRN

## 2022-07-20 MED ORDER — METFORMIN HCL 1000 MG PO TABS: 500.0000 mg | ORAL_TABLET | Freq: Two times a day (BID) | ORAL | 0 refills | Status: AC

## 2022-07-20 NOTE — Inpatient Diabetes Management (Addendum)
Inpatient Diabetes Program Recommendations  AACE/ADA: New Consensus Statement on Inpatient Glycemic Control (2015)  Target Ranges:  Prepandial:   less than 140 mg/dL      Peak postprandial:   less than 180 mg/dL (1-2 hours)      Critically ill patients:  140 - 180 mg/dL   Lab Results  Component Value Date   GLUCAP 185 (H) 07/20/2022   HGBA1C >15.5 (H) 01/30/2020    Review of Glycemic Control  Latest Reference Range & Units 07/19/22 07:53 07/19/22 11:52 07/19/22 12:08 07/19/22 15:31 07/19/22 21:38 07/20/22 07:26  Glucose-Capillary 70 - 99 mg/dL 279 (H) 157 (H) 181 (H) 136 (H) 213 (H) 185 (H)  (H): Data is abnormally high   Discharge Recommendations Other recommendations: Novolog 3 units TID with meals; Freestyle Libre 3 Order # [id number]  ($0 co-pay) Long acting recommendations: Insulin Glargine (LANTUS) Solostar Pen 15 units QHS  Supply/Referral recommendations: Glucometer Test strips Lancet device Lancets Pen needles - standard  Will speak with patient again this morning.  Spoke with patient and husband at bedside.  Husband speaks better [place].  Reviewed DM education from yesterday.  Reviewed insulin pen administration.  [name] (daughter) will be assisting them at home.   Husband states they can read numbers and he verbalized appropriate glucose goals.  Reviewed hypoglycemia, signs, symptoms and treatments.  They would like the Freestyle Libre 3.  She has a Radiation protection practitioner phone; the Elenor Legato does not work with this cell phone.  She will need the [place] reader as well.  Sent [place] to Dr. Posey Pronto.    They will follow up with their PCP.  Thank you, Reche Dixon, MSN, [place] Diabetes Coordinator Inpatient Diabetes Program [phone] (team pager from 8a-5p)

## 2022-07-20 NOTE — Telephone Encounter (Signed)
Patient Advocate Encounter  Prior Authorization for [employer] 3 Sensor has been approved.    PA# [id number] Effective dates: 07/19/2022 through 05/29/2023  Patients co-pay is $0.00.     Lyndel Safe, [place] Patient Advocate Specialist [place] Patient Advocate Team Direct Number: [phone]  Fax: [phone]

## 2022-07-20 NOTE — Progress Notes (Signed)
DISCHARGE NOTE HOME Jillian Travis to be discharged Home per MD order. Discussed prescriptions and follow up appointments with the patient and her boyfriend Yphong Rcam. Attempted to use interpreter services for discharge instruction, however, pt. Is hard of hearing and was unable to understand interpreter. Boyfriend in at time of discharge, states he speaks [place] and will translate. Discharge instructions and medication list explained in detail. Boyfriend and pt. Verbalized understanding.  Skin clean, dry and intact without evidence of skin break down, no evidence of skin tears noted. IV catheter discontinued intact. Site without signs and symptoms of complications. Dressing and pressure applied. Pt denies pain at the site currently. No complaints noted.  Patient free of lines, drains, and wounds.   An After Visit Summary (AVS) was printed and given to the patient. Patient escorted via wheelchair, and discharged home via private auto.  Anastasio Auerbach, RN

## 2022-07-20 NOTE — TOC Transition Note (Signed)
Transition of Care [facility]) - CM/SW Discharge Note   Patient Details  Name: Jillian Travis MRN: [id number] Date of Birth: 1950/07/09  Transition of Care [facility]) CM/SW Contact:  Tom-Johnson, Renea Ee, RN Phone Number: 07/20/2022, 10:49 AM   Clinical Narrative:     Patient is scheduled for discharge today. Prior Authorization for [employer] 3 Sensor has been approved and co-pay is $ 0.00. Diabetic education done by Diabetic coordinator and RN with teach back. Patient states she understands how to administer self insulin.  Hospital f/u and outpatient instructions on AVS.  Family to transport at discharge. No further TOC needs noted.                Final next level of care: Home/Self Care Barriers to Discharge: Barriers Resolved   Patient Goals and CMS Choice CMS Medicare.gov Compare Post Acute Care list provided to:: Patient Choice offered to / list presented to : Patient, Adult Children  Discharge Placement                  Patient to be transferred to facility by: Family      Discharge Plan and Services Additional resources added to the After Visit Summary for     Discharge Planning Services: CM Consult Post Acute Care Choice: NA          DME Arranged: N/A DME Agency: NA       HH Arranged: NA HH Agency: NA        Social Determinants of Health (SDOH) Interventions SDOH Screenings   Tobacco Use: Low Risk  (07/18/2022)     Readmission Risk Interventions     No data to display

## 2022-07-20 NOTE — Discharge Summary (Signed)
Physician Discharge Summary   Patient: Jillian Travis MRN: [id number] DOB: 08/14/1950  Admit date:     07/18/2022  Discharge date: 07/20/2022  Discharge Physician: Berle Mull  PCP: Leonard Downing, MD  Recommendations at discharge:  Follow up with PCP in 1 week   Follow-up Information     Leonard Downing, MD. Schedule an appointment as soon as possible for a visit in 1 week(s).   Specialty: Family Medicine Contact information: [place] [address] [phone]                Unresulted Labs (From admission, onward)     Start     Ordered   07/18/22 1819  Hemoglobin A1c  (Hyperglycemic Hyperosmolar State (HHS))  Add-on,   AD       Comments: To assess prior glycemic control.    07/18/22 1820           Discharge Diagnoses: Principal Problem:   Type 2 diabetes mellitus with hyperosmolar nonketotic hyperglycemia (HCC) Active Problems:   Type 2 diabetes mellitus with hyperosmolar hyperglycemic state (HHS) ([place]) HLD  Hospital Course: PMH of type II DM, HLD present to the hospital with complaints of polyuria polydipsia.  Found to have CBG more than 600.  Treated with IV insulin.   Assessment and Plan  Type 2 diabetes mellitus, uncontrolled with hyperglycemia. Presentation with hyperosmolar hyperglycemic state. Treated with IV fluids and IV insulin. No anion gap. Currently improving sugar level. On basal bolus regimen right now. Has not used any insulin therapy despite prescription in 2021. We educated the patient for insulin therapy. Hemoglobin A1c last time checked was more than detectable level. Tolerating oral diet. Will also get free style libre for the pt.    Hard of hearing. Monitor.   HLD New prescription given   Consultants:  none  Procedures performed:  none  DISCHARGE MEDICATION: Allergies as of 07/20/2022   No Known Allergies      Medication List     STOP taking these medications    amoxicillin 500 MG  capsule Commonly known as: AMOXIL   blood glucose meter kit and supplies Kit   insulin aspart 100 UNIT/ML injection Commonly known as: novoLOG Replaced by: insulin aspart 100 UNIT/ML FlexPen   insulin glargine 100 UNIT/ML injection Commonly known as: LANTUS Replaced by: insulin glargine 100 UNIT/ML Solostar Pen       TAKE these medications    Blood Glucose Monitoring Suppl Devi 1 each by Does not apply route 3 (three) times daily. May dispense any manufacturer covered by patient's insurance.   BLOOD GLUCOSE TEST STRIPS Strp 1 each by Does not apply route 3 (three) times daily. Use as directed to check blood sugar. May dispense any manufacturer covered by patient's insurance and fits patient's device.   FreeStyle Libre 2 Reader [place] 1 each by Does not apply route once for 1 dose.   FreeStyle Libre 3 Sensor Misc 1 each by Does not apply route once for 1 dose. Place 1 sensor on the skin every 14 days. Use to check glucose continuously   Glucagon Emergency 1 MG Kit Inject 1 mg into the skin once as needed for up to 1 dose (low blood sugar).   insulin aspart 100 UNIT/ML FlexPen Commonly known as: NOVOLOG Inject 3 Units into the skin 3 (three) times daily with meals. Only take if going to eat > 50% of meals. Replaces: insulin aspart 100 UNIT/ML injection   insulin glargine 100 UNIT/ML Solostar  Pen Commonly known as: LANTUS Inject 15 Units into the skin daily. May substitute as needed per insurance. Replaces: insulin glargine 100 UNIT/ML injection   Lancet Device Misc 1 each by Does not apply route 3 (three) times daily. May dispense any manufacturer covered by patient's insurance.   Lancets Misc 1 each by Does not apply route 3 (three) times daily. Use as directed to check blood sugar. May dispense any manufacturer covered by patient's insurance and fits patient's device.   metFORMIN 1000 MG tablet Commonly known as: GLUCOPHAGE Take 0.5 tablets (500 mg total) by mouth 2  (two) times daily.   Pen Needles 31G X 5 MM Misc 1 each by Does not apply route 3 (three) times daily. May dispense any manufacturer covered by patient's insurance.   simvastatin 10 MG tablet Commonly known as: ZOCOR Take 1 tablet (10 mg total) by mouth at bedtime.       Disposition: Home Diet recommendation: Carb modified diet  Discharge Exam: Vitals:   07/19/22 1711 07/19/22 2158 07/20/22 0526 07/20/22 0941  BP: (!) 151/71 (!) 138/94 (!) 150/74 (!) 150/82  Pulse: 66 69 74 73  Resp: 18 18 18 18  $ Temp: 98.1 F (36.7 C) 98.2 F (36.8 C) 98.1 F (36.7 C) 98.2 F (36.8 C)  TempSrc: Oral Oral Oral   SpO2: 99% 99% 100% 97%  Weight:      Height:       General: Appear in no distress; no visible Abnormal Neck Mass Or lumps, Conjunctiva normal Cardiovascular: S1 and S2 Present, no Murmur, Respiratory: good respiratory effort, Bilateral Air entry present and CTA, no Crackles, no wheezes  Filed Weights   07/18/22 1139 07/19/22 0302  Weight: 54.9 kg 52 kg   Condition at discharge: stable  The results of significant diagnostics from this hospitalization (including imaging, microbiology, ancillary and laboratory) are listed below for reference.   Imaging Studies: DG Chest Portable 1 View  Addendum Date: 07/18/2022   ADDENDUM REPORT: 07/18/2022 13:25 ADDENDUM: Speech recognition error. Density projected over the left upper chest relates to the first rib end. Electronically Signed   By: Nelson Chimes M.D.   On: 07/18/2022 13:25   Result Date: 07/18/2022 CLINICAL DATA:  Dizziness and weakness. History of congestive heart failure. EXAM: PORTABLE CHEST 1 VIEW COMPARISON:  01/29/2020 FINDINGS: Heart size is normal. Chronic aortic atherosclerosis is noted. There is no evidence of heart failure presently. No interstitial or alveolar edema. No pleural effusion. Density projected over the left upper chest relates to the first rib in. No acute bone finding. IMPRESSION: No active disease. Aortic  atherosclerosis. No evidence of heart failure. Density projected over the left upper chest relates to the first rib in. Electronically Signed: By: Nelson Chimes M.D. On: 07/18/2022 13:18    Microbiology: Results for orders placed or performed during the hospital encounter of 07/18/22  Resp panel by RT-PCR (RSV, Flu A&B, Covid) Anterior Nasal Swab     Status: None   Collection Time: 07/18/22 12:45 PM   Specimen: Anterior Nasal Swab  Result Value Ref Range Status   SARS Coronavirus 2 by RT PCR NEGATIVE NEGATIVE Final   Influenza A by PCR NEGATIVE NEGATIVE Final   Influenza B by PCR NEGATIVE NEGATIVE Final    Comment: (NOTE) The Xpert Xpress SARS-CoV-2/FLU/RSV plus assay is intended as an aid in the diagnosis of influenza from Nasopharyngeal swab specimens and should not be used as a sole basis for treatment. Nasal washings and aspirates are unacceptable for Xpert  Xpress SARS-CoV-2/FLU/RSV testing.  Fact Sheet for Patients: EntrepreneurPulse.com.au  Fact Sheet for Healthcare Providers: IncredibleEmployment.be  This test is not yet approved or cleared by the [place] FDA and has been authorized for detection and/or diagnosis of SARS-CoV-2 by FDA under an Emergency Use Authorization (EUA). This EUA will remain in effect (meaning this test can be used) for the duration of the COVID-19 declaration under Section 564(b)(1) of the Act, 21 U.S.C. section 360bbb-3(b)(1), unless the authorization is terminated or revoked.     Resp Syncytial Virus by PCR NEGATIVE NEGATIVE Final    Comment: (NOTE) Fact Sheet for Patients: EntrepreneurPulse.com.au  Fact Sheet for Healthcare Providers: IncredibleEmployment.be  This test is not yet approved or cleared by the [place] FDA and has been authorized for detection and/or diagnosis of SARS-CoV-2 by FDA under an Emergency Use Authorization (EUA). This EUA will remain in  effect (meaning this test can be used) for the duration of the COVID-19 declaration under Section 564(b)(1) of the Act, 21 U.S.C. section 360bbb-3(b)(1), unless the authorization is terminated or revoked.  Performed at [facility], [place] [address] [place], [place] [address]    Labs: CBC: Recent Labs  Lab 07/18/22 1143 07/18/22 2124  WBC 4.5 5.7  HGB 13.1 12.3  HCT 37.8 35.8*  MCV 82.9 83.6  PLT 199 0000000   Basic Metabolic Panel: Recent Labs  Lab 07/18/22 1143 07/18/22 1814 07/18/22 2124 07/19/22 0100 07/19/22 0235 07/19/22 0623 07/20/22 0320  NA 125*   < > 136 138 135 136 138  K 4.7   < > 3.8 3.2* 3.4* 3.7 4.0  CL 92*   < > 105 102 105 105 107  CO2 23   < > 28 28 24 22 25  $ GLUCOSE 781*   < > 204* 120* 302* 322* 148*  BUN 18   < > 10 10 10 10 14  $ CREATININE 1.20*   < > 0.79 0.71 0.79 0.76 1.07*  CALCIUM 9.1   < > 8.3* 8.8* 8.4* 8.4* 9.1  MG 1.8  --   --   --   --   --  1.6*   < > = values in this interval not displayed.   Liver Function Tests: Recent Labs  Lab 07/18/22 1143  AST 25  ALT 14  ALKPHOS 85  BILITOT 0.7  PROT 6.8  ALBUMIN 3.5   CBG: Recent Labs  Lab 07/19/22 1152 07/19/22 1208 07/19/22 1531 07/19/22 2138 07/20/22 0726  GLUCAP 157* 181* 136* 213* 185*    Discharge time spent: greater than 30 minutes.  Signed: Berle Mull, MD [facility] 07/20/2022

## 2022-07-26 NOTE — Congregational Nurse Program (Signed)
CN office visit with her husband serving as interpreter.  He stated she is on a new insulin (Novalin 3 units) and it has to be drawn up in a syringe.  They do not know how to measure 3 units.  CN demonstrated and husband was able to understand how to measure.  They also had concerns about cost stating that her insurance did not cover Novalog.  Phone call to [facility] who stated that patient could ask her doctor about changing prescription to Humalog which is similar and would be covered. Jake Michaelis RN, Congregational Nurse [phone]

## 2022-10-21 ENCOUNTER — Emergency Department (HOSPITAL_COMMUNITY): Payer: 59

## 2022-10-21 ENCOUNTER — Encounter (HOSPITAL_COMMUNITY): Payer: Self-pay

## 2022-10-21 ENCOUNTER — Inpatient Hospital Stay (HOSPITAL_COMMUNITY)
Admission: EM | Admit: 2022-10-21 | Discharge: 2022-10-24 | DRG: 917 | Disposition: A | Discharge status: Home / Self Care | Payer: 59 | Attending: Internal Medicine | Admitting: Internal Medicine

## 2022-10-21 DIAGNOSIS — E11649 Type 2 diabetes mellitus with hypoglycemia without coma: Secondary | ICD-10-CM

## 2022-10-21 DIAGNOSIS — Z7984 Long term (current) use of oral hypoglycemic drugs: Secondary | ICD-10-CM

## 2022-10-21 DIAGNOSIS — T68XXXA Hypothermia, initial encounter: Secondary | ICD-10-CM | POA: Diagnosis not present

## 2022-10-21 DIAGNOSIS — R230 Cyanosis: Secondary | ICD-10-CM | POA: Diagnosis present

## 2022-10-21 DIAGNOSIS — T383X5A Adverse effect of insulin and oral hypoglycemic [antidiabetic] drugs, initial encounter: Secondary | ICD-10-CM

## 2022-10-21 DIAGNOSIS — Z603 Acculturation difficulty: Secondary | ICD-10-CM | POA: Diagnosis present

## 2022-10-21 DIAGNOSIS — Z794 Long term (current) use of insulin: Secondary | ICD-10-CM

## 2022-10-21 DIAGNOSIS — Z91198 Patient's noncompliance with other medical treatment and regimen for other reason: Secondary | ICD-10-CM

## 2022-10-21 DIAGNOSIS — E160 Drug-induced hypoglycemia without coma: Secondary | ICD-10-CM | POA: Diagnosis not present

## 2022-10-21 DIAGNOSIS — Z79899 Other long term (current) drug therapy: Secondary | ICD-10-CM

## 2022-10-21 DIAGNOSIS — G9341 Metabolic encephalopathy: Secondary | ICD-10-CM

## 2022-10-21 DIAGNOSIS — R680 Hypothermia, not associated with low environmental temperature: Secondary | ICD-10-CM | POA: Diagnosis present

## 2022-10-21 DIAGNOSIS — T383X1A Poisoning by insulin and oral hypoglycemic [antidiabetic] drugs, accidental (unintentional), initial encounter: Principal | ICD-10-CM | POA: Diagnosis present

## 2022-10-21 DIAGNOSIS — E1165 Type 2 diabetes mellitus with hyperglycemia: Secondary | ICD-10-CM | POA: Diagnosis not present

## 2022-10-21 DIAGNOSIS — E785 Hyperlipidemia, unspecified: Secondary | ICD-10-CM | POA: Diagnosis present

## 2022-10-21 DIAGNOSIS — R4182 Altered mental status, unspecified: Secondary | ICD-10-CM | POA: Diagnosis not present

## 2022-10-21 DIAGNOSIS — N390 Urinary tract infection, site not specified: Secondary | ICD-10-CM

## 2022-10-21 DIAGNOSIS — E162 Hypoglycemia, unspecified: Principal | ICD-10-CM | POA: Diagnosis present

## 2022-10-21 DIAGNOSIS — E876 Hypokalemia: Secondary | ICD-10-CM

## 2022-10-21 HISTORY — DX: Hyperlipidemia, unspecified: E78.5

## 2022-10-21 HISTORY — DX: Type 2 diabetes mellitus without complications: E11.9

## 2022-10-21 LAB — CBC WITH DIFFERENTIAL/PLATELET
Abs Immature Granulocytes: 0.04 10*3/uL (ref 0.00–0.07)
Basophils Absolute: 0.1 10*3/uL (ref 0.0–0.1)
Basophils Relative: 1 %
Eosinophils Absolute: 0.3 10*3/uL (ref 0.0–0.5)
Eosinophils Relative: 2 %
HCT: 44.8 % (ref 36.0–46.0)
Hemoglobin: 14.5 g/dL (ref 12.0–15.0)
Immature Granulocytes: 0 %
Lymphocytes Relative: 29 %
Lymphs Abs: 3.5 10*3/uL (ref 0.7–4.0)
MCH: 28.9 pg (ref 26.0–34.0)
MCHC: 32.4 g/dL (ref 30.0–36.0)
MCV: 89.4 fL (ref 80.0–100.0)
Monocytes Absolute: 1 10*3/uL (ref 0.1–1.0)
Monocytes Relative: 8 %
Neutro Abs: 7.2 10*3/uL (ref 1.7–7.7)
Neutrophils Relative %: 60 %
Platelets: 231 10*3/uL (ref 150–400)
RBC: 5.01 MIL/uL (ref 3.87–5.11)
RDW: 13.2 % (ref 11.5–15.5)
WBC: 12 10*3/uL — ABNORMAL HIGH (ref 4.0–10.5)
nRBC: 0 % (ref 0.0–0.2)

## 2022-10-21 LAB — MAGNESIUM: Magnesium: 1.8 mg/dL (ref 1.7–2.4)

## 2022-10-21 LAB — RAPID URINE DRUG SCREEN, HOSP PERFORMED
Amphetamines: NOT DETECTED
Barbiturates: NOT DETECTED
Benzodiazepines: NOT DETECTED
Cocaine: NOT DETECTED
Opiates: NOT DETECTED
Tetrahydrocannabinol: NOT DETECTED

## 2022-10-21 LAB — BASIC METABOLIC PANEL
Anion gap: 8 (ref 5–15)
BUN: 11 mg/dL (ref 8–23)
CO2: 27 mmol/L (ref 22–32)
Calcium: 9 mg/dL (ref 8.9–10.3)
Chloride: 105 mmol/L (ref 98–111)
Creatinine, Ser: 1.04 mg/dL — ABNORMAL HIGH (ref 0.44–1.00)
GFR, Estimated: 57 mL/min — ABNORMAL LOW (ref >60)
Glucose, Bld: 148 mg/dL — ABNORMAL HIGH (ref 70–99)
Potassium: 4.1 mmol/L (ref 3.5–5.1)
Sodium: 140 mmol/L (ref 135–145)

## 2022-10-21 LAB — CBC
HCT: 37 % (ref 36.0–46.0)
Hemoglobin: 12 g/dL (ref 12.0–15.0)
MCH: 28 pg (ref 26.0–34.0)
MCHC: 32.4 g/dL (ref 30.0–36.0)
MCV: 86.4 fL (ref 80.0–100.0)
Platelets: 180 10*3/uL (ref 150–400)
RBC: 4.28 MIL/uL (ref 3.87–5.11)
RDW: 13.2 % (ref 11.5–15.5)
WBC: 7.9 10*3/uL (ref 4.0–10.5)
nRBC: 0 % (ref 0.0–0.2)

## 2022-10-21 LAB — COMPREHENSIVE METABOLIC PANEL
ALT: 22 U/L (ref 0–44)
AST: 35 U/L (ref 15–41)
Albumin: 3.8 g/dL (ref 3.5–5.0)
Alkaline Phosphatase: 66 U/L (ref 38–126)
Anion gap: 13 (ref 5–15)
BUN: 12 mg/dL (ref 8–23)
CO2: 22 mmol/L (ref 22–32)
Calcium: 9.5 mg/dL (ref 8.9–10.3)
Chloride: 105 mmol/L (ref 98–111)
Creatinine, Ser: 1.05 mg/dL — ABNORMAL HIGH (ref 0.44–1.00)
GFR, Estimated: 56 mL/min — ABNORMAL LOW (ref >60)
Glucose, Bld: <20 mg/dL — CL (ref 70–99)
Potassium: 3.2 mmol/L — ABNORMAL LOW (ref 3.5–5.1)
Sodium: 140 mmol/L (ref 135–145)
Total Bilirubin: 0.5 mg/dL (ref 0.3–1.2)
Total Protein: 7.8 g/dL (ref 6.5–8.1)

## 2022-10-21 LAB — GLUCOSE, CAPILLARY
Glucose-Capillary: 158 mg/dL — ABNORMAL HIGH (ref 70–99)
Glucose-Capillary: 174 mg/dL — ABNORMAL HIGH (ref 70–99)
Glucose-Capillary: 159 mg/dL — ABNORMAL HIGH (ref 70–99)
Glucose-Capillary: 139 mg/dL — ABNORMAL HIGH (ref 70–99)
Glucose-Capillary: 159 mg/dL — ABNORMAL HIGH (ref 70–99)
Glucose-Capillary: 92 mg/dL (ref 70–99)
Glucose-Capillary: 81 mg/dL (ref 70–99)
Glucose-Capillary: 242 mg/dL — ABNORMAL HIGH (ref 70–99)
Glucose-Capillary: 200 mg/dL — ABNORMAL HIGH (ref 70–99)
Glucose-Capillary: 167 mg/dL — ABNORMAL HIGH (ref 70–99)
Glucose-Capillary: 204 mg/dL — ABNORMAL HIGH (ref 70–99)
Glucose-Capillary: 182 mg/dL — ABNORMAL HIGH (ref 70–99)
Glucose-Capillary: 178 mg/dL — ABNORMAL HIGH (ref 70–99)
Glucose-Capillary: 166 mg/dL — ABNORMAL HIGH (ref 70–99)
Glucose-Capillary: 138 mg/dL — ABNORMAL HIGH (ref 70–99)

## 2022-10-21 LAB — URINALYSIS, W/ REFLEX TO CULTURE (INFECTION SUSPECTED)
Bilirubin Urine: NEGATIVE
Glucose, UA: 50 mg/dL — AB
Hgb urine dipstick: NEGATIVE
Ketones, ur: NEGATIVE mg/dL
Nitrite: POSITIVE — AB
Protein, ur: NEGATIVE mg/dL
Specific Gravity, Urine: 1.005 (ref 1.005–1.030)
pH: 7 (ref 5.0–8.0)

## 2022-10-21 LAB — CBG MONITORING, ED
Glucose-Capillary: 103 mg/dL — ABNORMAL HIGH (ref 70–99)
Glucose-Capillary: 12 mg/dL — CL (ref 70–99)
Glucose-Capillary: 122 mg/dL — ABNORMAL HIGH (ref 70–99)
Glucose-Capillary: 212 mg/dL — ABNORMAL HIGH (ref 70–99)
Glucose-Capillary: 72 mg/dL (ref 70–99)
Glucose-Capillary: 89 mg/dL (ref 70–99)

## 2022-10-21 LAB — ETHANOL: Alcohol, Ethyl (B): <10 mg/dL (ref <10)

## 2022-10-21 MED ORDER — KETOROLAC TROMETHAMINE 15 MG/ML IJ SOLN
15.0000 mg | Freq: Once | INTRAMUSCULAR | Status: AC
  Administered 2022-10-21: 15 mg via INTRAVENOUS
  Filled 2022-10-21: qty 1

## 2022-10-21 MED ORDER — ENOXAPARIN SODIUM 40 MG/0.4ML IJ SOSY
40.0000 mg | PREFILLED_SYRINGE | INTRAMUSCULAR | Status: DC
  Administered 2022-10-21 – 2022-10-23 (×3): 40 mg via SUBCUTANEOUS
  Filled 2022-10-21 (×3): qty 0.4

## 2022-10-21 MED ORDER — DEXTROSE-SODIUM CHLORIDE 5-0.45 % IV SOLN: INTRAVENOUS | Status: DC

## 2022-10-21 MED ORDER — SODIUM CHLORIDE 0.9 % IV SOLN
2.0000 g | Freq: Once | INTRAVENOUS | Status: AC
  Administered 2022-10-21: 2 g via INTRAVENOUS
  Filled 2022-10-21: qty 20

## 2022-10-21 MED ORDER — ACETAMINOPHEN 325 MG PO TABS
650.0000 mg | ORAL_TABLET | Freq: Four times a day (QID) | ORAL | Status: DC | PRN
  Administered 2022-10-21 – 2022-10-24 (×4): 650 mg via ORAL
  Filled 2022-10-21 (×5): qty 2

## 2022-10-21 MED ORDER — ONDANSETRON HCL 4 MG PO TABS: 4.0000 mg | ORAL_TABLET | Freq: Four times a day (QID) | ORAL | Status: DC | PRN

## 2022-10-21 MED ORDER — ONDANSETRON HCL 4 MG/2ML IJ SOLN: 4.0000 mg | Freq: Four times a day (QID) | INTRAMUSCULAR | Status: DC | PRN

## 2022-10-21 MED ORDER — DEXTROSE 50 % IV SOLN
1.0000 | Freq: Once | INTRAVENOUS | Status: AC
  Administered 2022-10-21: 50 mL via INTRAVENOUS

## 2022-10-21 MED ORDER — DEXTROSE 50 % IV SOLN
INTRAVENOUS | Status: AC
  Filled 2022-10-21: qty 50

## 2022-10-21 MED ORDER — DEXTROSE 10 % IV SOLN: INTRAVENOUS | Status: DC

## 2022-10-21 MED ORDER — POTASSIUM CHLORIDE CRYS ER 20 MEQ PO TBCR
40.0000 meq | EXTENDED_RELEASE_TABLET | Freq: Once | ORAL | Status: AC
  Administered 2022-10-21: 40 meq via ORAL
  Filled 2022-10-21: qty 2

## 2022-10-21 NOTE — Assessment & Plan Note (Signed)
Mental status improved but still not back to baseline, if alteration persists, may want to get MRI brain as pt may have had hypoglycemic brain injury due to prolonged period of hypoglycemia.

## 2022-10-21 NOTE — ED Notes (Signed)
Pt to CT scanner at this time with this RN and cardiac monitoring

## 2022-10-21 NOTE — ED Notes (Signed)
Rectal temperature probe placed for temp management

## 2022-10-21 NOTE — H&P (Signed)
History and Physical    Patient: Jillian Travis [id number] DOB: April 30, 1951 DOA: 10/21/2022 DOS: the patient was seen and examined on 10/21/2022 PCP: No primary care provider on file.  Patient coming from: Home  Chief Complaint:  Chief Complaint  Patient presents with   Unresponsive   HPI: Jillian Travis is a 72 y.o. female with medical history significant of DM2, HLD.  DM seems poorly controlled, pt admitted for HHS in Feb this year.  Pt on insulin but doesn't check CBGs at home.  Pt with AMS over past couple of days, progressed to unresponsiveness over past couple of hours per son.  Pt brought in to ED.    Review of Systems: unable to review all systems due to the inability of the patient to answer questions. Past Medical History:  Diagnosis Date   Diabetes mellitus without complication (HCC)    HLD (hyperlipidemia)    History reviewed. No pertinent surgical history. Social History:  reports that she has never smoked. She has never used smokeless tobacco. She reports that she does not drink alcohol and does not use drugs.  No Known Allergies  History reviewed. No pertinent family history.  Prior to Admission medications   Medication Sig Start Date End Date Taking? Authorizing Provider  HUMALOG KWIKPEN 100 UNIT/ML KwikPen Inject 3 Units into the skin 3 (three) times daily. 09/21/22  Yes [provider]  LANTUS SOLOSTAR 100 UNIT/ML Solostar Pen Inject 18 Units into the skin daily. 10/16/22  Yes [provider]  metFORMIN (GLUCOPHAGE) 1000 MG tablet Take 500 mg by mouth every 12 (twelve) hours. 09/16/22  Yes [provider]  simvastatin (ZOCOR) 10 MG tablet 10 mg every evening. 10/04/22  Yes [provider]    Physical Exam: Vitals:   10/21/22 0044 10/21/22 0045 10/21/22 0110 10/21/22 0159  BP:      Pulse: 86 86  81  Resp: 19 19  18   Temp:   (!) 94.7 F (34.8 C) (!) 95.1 F (35.1 C)  TempSrc:   Rectal Core (Comment)  SpO2: 100% 100%  100%    Constitutional: NAD Respiratory: clear to auscultation bilaterally, no wheezing, no crackles. Normal respiratory effort. No accessory muscle use.  Cardiovascular: Regular rate and rhythm, no murmurs / rubs / gallops. No extremity edema. 2+ pedal pulses. No carotid bruits.  Abdomen: no tenderness, no masses palpated. No hepatosplenomegaly. Bowel sounds positive.  Neurologic: MAE, grossly non-focal Psychiatric: awake, but encephalopathic Data Reviewed:    Labs on Admission: I have personally reviewed following labs and imaging studies  CBC: Recent Labs  Lab 10/21/22 0040  WBC 12.0*  NEUTROABS 7.2  HGB 14.5  HCT 44.8  MCV 89.4  PLT 231   Basic Metabolic Panel: Recent Labs  Lab 10/21/22 0040  NA 140  K 3.2*  CL 105  CO2 22  GLUCOSE <20*  BUN 12  CREATININE 1.05*  CALCIUM 9.5   GFR: CrCl cannot be calculated (Unknown ideal weight.). Liver Function Tests: Recent Labs  Lab 10/21/22 0040  AST 35  ALT 22  ALKPHOS 66  BILITOT 0.5  PROT 7.8  ALBUMIN 3.8    CBG: Recent Labs  Lab 10/21/22 0034 10/21/22 0039 10/21/22 0105 10/21/22 0142  GLUCAP 12* 212* 122* 89   Radiological Exams on Admission: CT Head Wo Contrast  Result Date: 10/21/2022 CLINICAL DATA:  Altered mental status. EXAM: CT HEAD WITHOUT CONTRAST TECHNIQUE: Contiguous axial images were obtained from the base of the skull through the vertex without intravenous  contrast. RADIATION DOSE REDUCTION: This exam was performed according to the departmental dose-optimization program which includes automated exposure control, adjustment of the mA and/or kV according to patient size and/or use of iterative reconstruction technique. COMPARISON:  January 29, 2020 FINDINGS: Brain: There is mild cerebral atrophy with widening of the extra-axial spaces and ventricular dilatation. There are areas of decreased attenuation within the white matter tracts of the supratentorial brain, consistent with microvascular disease  changes. Vascular: No hyperdense vessel or unexpected calcification. Skull: Negative for an acute osseous abnormality. Small stable, benign-appearing lytic areas are seen scattered throughout the skull. Sinuses/Orbits: There is mild to moderate severity right maxillary sinus mucosal thickening. Other: None. IMPRESSION: 1. No acute intracranial abnormality. 2. Generalized cerebral atrophy and microvascular disease changes of the supratentorial brain. 3. Mild to moderate severity right maxillary sinus disease. Electronically Signed   By: Aram Candela M.D.   On: 10/21/2022 01:42   DG Chest Port 1 View  Result Date: 10/21/2022 CLINICAL DATA:  Altered mental status. EXAM: PORTABLE CHEST 1 VIEW COMPARISON:  July 18, 2022 FINDINGS: The cardiac silhouette is mildly enlarged. This is increased in size when compared to the prior study and may be, in part, technical in origin. Mild prominence of the pulmonary vasculature is seen. Mild, diffuse, chronic appearing increased lung markings are seen. Mild atelectasis is noted within the left lung base. A small left pleural effusion is seen. No pneumothorax is identified. The visualized skeletal structures are unremarkable. IMPRESSION: 1. Mild pulmonary vascular congestion with mild left basilar atelectasis. 2. Small left pleural effusion. Electronically Signed   By: Aram Candela M.D.   On: 10/21/2022 00:59       Assessment and Plan: * Uncontrolled type 2 diabetes mellitus with hypoglycemia, with long-term current use of insulin (HCC) H/o HHS admit in Feb. Hold home DM meds D10 @ 100cc/hr Bair hugger CBG checks Q1H for the moment  Acute metabolic encephalopathy Mental status improved but still not back to baseline, if alteration persists, may want to get MRI brain as pt may have had hypoglycemic brain injury due to prolonged period of hypoglycemia.  Hypothermia Due to hypoglycemia. Lawyer.  Hypokalemia Replace K Repeat BMP in AM Check  Mg      Advance Care Planning:   Code Status: Full Code Default  Consults: None  Family Communication: No family in room  Severity of Illness: The appropriate patient status for this patient is OBSERVATION. Observation status is judged to be reasonable and necessary in order to provide the required intensity of service to ensure the patient's safety. The patient's presenting symptoms, physical exam findings, and initial radiographic and laboratory data in the context of their medical condition is felt to place them at decreased risk for further clinical deterioration. Furthermore, it is anticipated that the patient will be medically stable for discharge from the hospital within 2 midnights of admission.   Author: Hillary Bow., DO 10/21/2022 2:14 AM  For on call review [email or web address].

## 2022-10-21 NOTE — Assessment & Plan Note (Addendum)
H/o HHS admit in Feb. Hold home DM meds D10 @ 100cc/hr Bair hugger CBG checks Q1H for the moment

## 2022-10-21 NOTE — Progress Notes (Signed)
Briefly, patient is a 72 year old female with DM2 who speaks only [place] who was admitted for altered mental status workup in ED showed profound hypoglycemia and hypothermia and patient was started on D10 at 100/hr.  It was noted that patient had been admitted for HHS 3 months ago and per report, patient is on insulin but does not check her blood glucoses at home.  UA was positive for UTI and patient was started on ceftriaxone.  When I went to see patient today, she was sitting up on side of bed and eating.  Patient indicated that she does understand some English and appropriately answers yes/no questions.  Notes that she speaks [place].  A/P:  Hypoglycemia DM2 on insulin, does not check sugars Patient's blood sugars are now in the 200s on D10 at 100 Will place patient on D5 at 75 She is eating and drinking well Will attempt to taper off D5 tomorrow Place consult for diabetes coordinator  Acute metabolic encephalopathy Resolved with treatment of hyperglycemia She is back to baseline  UTI Continue ceftriaxone day #1  Hypothermia Resolved  Hypokalemia Normalized with repletion

## 2022-10-21 NOTE — Inpatient Diabetes Management (Signed)
Inpatient Diabetes Program Recommendations  AACE/ADA: New Consensus Statement on Inpatient Glycemic Control (2015)  Target Ranges:  Prepandial:   less than 140 mg/dL      Peak postprandial:   less than 180 mg/dL (1-2 hours)      Critically ill patients:  140 - 180 mg/dL    Latest Reference Range & Units 10/21/22 00:40  Glucose 70 - 99 mg/dL <04 (LL)  (LL): Data is critically low  Latest Reference Range & Units 10/21/22 00:34 10/21/22 00:39 10/21/22 01:05 10/21/22 01:42 10/21/22 02:18 10/21/22 02:42 10/21/22 03:50 10/21/22 05:05 10/21/22 06:14  Glucose-Capillary 70 - 99 mg/dL 12 (LL) 540 (H) 981 (H) 89 72 103 (H) 158 (H) 174 (H) 159 (H)  (LL): Data is critically low (H): Data is abnormally high  Latest Reference Range & Units 10/21/22 09:47 10/21/22 11:17 10/21/22 13:54  Glucose-Capillary 70 - 99 mg/dL 92 81 191 (H)  (H): Data is abnormally high   Admit with: Uncontrolled type 2 diabetes mellitus with hypoglycemia, with long-term current use of insulin   History: DM  Home DM Meds: Lantus 18 units Daily        Humalog 3 units TID with meals             Metformin 500 mg BID  Current Orders: D10% IVF 100cc/hr    MD- See notes below: Since pt has Prior Auth for the [employer] 3 Sensor and Reader, please re-order for pt at time of d/c.  Freestyle Libre 3 Sensor- Order # [id number] Dorothy 3 Reader- Order # [id number]   May consider stopping the D10% IVF for now as CBG at 2pm is now 254 mg/dl      Per Chart Review from Feb 2024, pt was given Rxs for both the Freestyle Libre 3 CGM sensor and the Reader  Prior Auth was started and Completed See Below  Patient Advocate Encounter  07/20/2022 Prior Authorization for [employer] 3 Sensor has been approved.   PA# [id number] Effective dates: 07/19/2022 through 05/29/2023 Patients co-pay is $0.00.      --Will follow patient during hospitalization--  Ambrose Finland RN, MSN, CDCES Diabetes  Coordinator Inpatient Glycemic Control Team Team Pager: [phone] (8a-5p)

## 2022-10-21 NOTE — ED Notes (Signed)
Pt arrived back from CT scanner, alert, NAD noted, pt remains on bear hugger and warm blankets

## 2022-10-21 NOTE — ED Notes (Signed)
Pt starting to arouse after 1 amp D50

## 2022-10-21 NOTE — ED Notes (Signed)
ED TO INPATIENT HANDOFF REPORT  ED Nurse Name and Phone #: [phone] [facility]  S Name/Age/Gender Jillian Travis 72 y.o. female Room/Bed: TRABC/TRABC  Code Status   Code Status: Full Code  Home/SNF/Other Home Patient oriented to: self, place, and situation Is this baseline? Yes   Triage Complete: Triage complete  Chief Complaint Hypoglycemia due to insulin [E16.0, T38.3X5A]  Triage Note Pt arrived POV from car unresponsive with agonal RR, staff removed pt from car and began assisting with ventilations and brought to trauma B. Family left in car, no history to provide or information.    Allergies No Known Allergies  Level of Care/Admitting Diagnosis ED Disposition     ED Disposition  Admit   Condition  --   Comment  Hospital Area: [facility] [[id number]]  Level of Care: Progressive [102]  Admit to Progressive based on following criteria: GI, ENDOCRINE disease patients with GI bleeding, acute liver failure or pancreatitis, stable with diabetic ketoacidosis or thyrotoxicosis (hypothyroid) state.  May place patient in observation at [facility] or [facility] if equivalent level of care is available:: No  Covid Evaluation: Asymptomatic - no recent exposure (last 10 days) testing not required  Diagnosis: Hypoglycemia due to insulin [454098]  Admitting Physician: Hillary Bow [[id number]  Attending Physician: Hillary Bow [phone]          B Medical/Surgery History Past Medical History:  Diagnosis Date   Diabetes mellitus without complication (HCC)    HLD (hyperlipidemia)    History reviewed. No pertinent surgical history.   A IV Location/Drains/Wounds Patient Lines/Drains/Airways Status     Active Line/Drains/Airways     Name Placement date Placement time Site Days   Peripheral IV 10/21/22 20 G Distal;Left;Posterior Forearm 10/21/22  0033  Forearm  less than 1   Peripheral IV 10/21/22 18 G Posterior;Right Forearm 10/21/22  0037  Forearm  less than  1            Intake/Output Last 24 hours  Intake/Output Summary (Last 24 hours) at 10/21/2022 0240 Last data filed at 10/21/2022 0159 Gross per 24 hour  Intake --  Output 600 ml  Net -600 ml    Labs/Imaging Results for orders placed or performed during the hospital encounter of 10/21/22 (from the past 48 hour(s))  CBG monitoring, ED     Status: Abnormal   Collection Time: 10/21/22 12:34 AM  Result Value Ref Range   Glucose-Capillary 12 (LL) 70 - 99 mg/dL    Comment: Glucose reference range applies only to samples taken after fasting for at least 8 hours.  CBG monitoring, ED     Status: Abnormal   Collection Time: 10/21/22 12:39 AM  Result Value Ref Range   Glucose-Capillary 212 (H) 70 - 99 mg/dL    Comment: Glucose reference range applies only to samples taken after fasting for at least 8 hours.  Comprehensive metabolic panel     Status: Abnormal   Collection Time: 10/21/22 12:40 AM  Result Value Ref Range   Sodium 140 135 - 145 mmol/L   Potassium 3.2 (L) 3.5 - 5.1 mmol/L   Chloride 105 98 - 111 mmol/L   CO2 22 22 - 32 mmol/L   Glucose, Bld <20 (LL) 70 - 99 mg/dL    Comment: CRITICAL RESULT CALLED TO, READ BACK BY AND VERIFIED WITH M. FOA, RN, 0140, 10/21/22, EADEDOKUN Glucose reference range applies only to samples taken after fasting for at least 8 hours.    BUN 12 8 -  23 mg/dL   Creatinine, Ser 8.11 (H) 0.44 - 1.00 mg/dL   Calcium 9.5 8.9 - 91.4 mg/dL   Total Protein 7.8 6.5 - 8.1 g/dL   Albumin 3.8 3.5 - 5.0 g/dL   AST 35 15 - 41 U/L   ALT 22 0 - 44 U/L   Alkaline Phosphatase 66 38 - 126 U/L   Total Bilirubin 0.5 0.3 - 1.2 mg/dL   GFR, Estimated 56 (L) >60 mL/min    Comment: (NOTE) Calculated using the CKD-EPI Creatinine Equation (2021)    Anion gap 13 5 - 15    Comment: Performed at [facility], [address], [place], [place] [address]  CBC with Differential     Status: Abnormal   Collection Time: 10/21/22 12:40 AM  Result Value Ref Range   WBC  12.0 (H) 4.0 - 10.5 K/uL   RBC 5.01 3.87 - 5.11 MIL/uL   Hemoglobin 14.5 12.0 - 15.0 g/dL   HCT 62.1 30.8 - 65.7 %   MCV 89.4 80.0 - 100.0 fL   MCH 28.9 26.0 - 34.0 pg   MCHC 32.4 30.0 - 36.0 g/dL   RDW 84.6 96.2 - 95.2 %   Platelets 231 150 - 400 K/uL   nRBC 0.0 0.0 - 0.2 %   Neutrophils Relative % 60 %   Neutro Abs 7.2 1.7 - 7.7 K/uL   Lymphocytes Relative 29 %   Lymphs Abs 3.5 0.7 - 4.0 K/uL   Monocytes Relative 8 %   Monocytes Absolute 1.0 0.1 - 1.0 K/uL   Eosinophils Relative 2 %   Eosinophils Absolute 0.3 0.0 - 0.5 K/uL   Basophils Relative 1 %   Basophils Absolute 0.1 0.0 - 0.1 K/uL   Immature Granulocytes 0 %   Abs Immature Granulocytes 0.04 0.00 - 0.07 K/uL    Comment: Performed at [facility], [address], [place], [place] [address]  Ethanol     Status: None   Collection Time: 10/21/22 12:40 AM  Result Value Ref Range   Alcohol, Ethyl (B) <10 <10 mg/dL    Comment: (NOTE) Lowest detectable limit for serum alcohol is 10 mg/dL.  For medical purposes only. Performed at [facility], [address], [place], [place] [address]   CBG monitoring, ED     Status: Abnormal   Collection Time: 10/21/22  1:05 AM  Result Value Ref Range   Glucose-Capillary 122 (H) 70 - 99 mg/dL    Comment: Glucose reference range applies only to samples taken after fasting for at least 8 hours.  CBG monitoring, ED     Status: None   Collection Time: 10/21/22  1:42 AM  Result Value Ref Range   Glucose-Capillary 89 70 - 99 mg/dL    Comment: Glucose reference range applies only to samples taken after fasting for at least 8 hours.  Urinalysis, w/ Reflex to Culture (Infection Suspected) -Urine, Catheterized     Status: Abnormal   Collection Time: 10/21/22  1:53 AM  Result Value Ref Range   Specimen Source URINE, CATHETERIZED    Color, Urine STRAW (A) YELLOW   APPearance CLEAR CLEAR   Specific Gravity, Urine 1.005 1.005 - 1.030   pH 7.0 5.0 - 8.0   Glucose, UA 50 (A) NEGATIVE  mg/dL   Hgb urine dipstick NEGATIVE NEGATIVE   Bilirubin Urine NEGATIVE NEGATIVE   Ketones, ur NEGATIVE NEGATIVE mg/dL   Protein, ur NEGATIVE NEGATIVE mg/dL   Nitrite POSITIVE (A) NEGATIVE  Leukocytes,Ua TRACE (A) NEGATIVE   RBC / HPF 0-5 0 - 5 RBC/hpf   WBC, UA 0-5 0 - 5 WBC/hpf    Comment:        Reflex urine culture not performed if WBC <=10, OR if Squamous epithelial cells >5. If Squamous epithelial cells >5 suggest recollection.    Bacteria, UA MANY (A) NONE SEEN   Squamous Epithelial / HPF 0-5 0 - 5 /HPF    Comment: Performed at [facility], [address], [place], [place] [address]  Urine rapid drug screen (hosp performed)     Status: None   Collection Time: 10/21/22  1:53 AM  Result Value Ref Range   Opiates NONE DETECTED NONE DETECTED   Cocaine NONE DETECTED NONE DETECTED   Benzodiazepines NONE DETECTED NONE DETECTED   Amphetamines NONE DETECTED NONE DETECTED   Tetrahydrocannabinol NONE DETECTED NONE DETECTED   Barbiturates NONE DETECTED NONE DETECTED    Comment: (NOTE) DRUG SCREEN FOR MEDICAL PURPOSES ONLY.  IF CONFIRMATION IS NEEDED FOR ANY PURPOSE, NOTIFY LAB WITHIN 5 DAYS.  LOWEST DETECTABLE LIMITS FOR URINE DRUG SCREEN Drug Class                     Cutoff (ng/mL) Amphetamine and metabolites    1000 Barbiturate and metabolites    200 Benzodiazepine                 200 Opiates and metabolites        300 Cocaine and metabolites        300 THC                            50 Performed at [facility], [address] [place], [place] [address]   CBG monitoring, ED     Status: None   Collection Time: 10/21/22  2:18 AM  Result Value Ref Range   Glucose-Capillary 72 70 - 99 mg/dL    Comment: Glucose reference range applies only to samples taken after fasting for at least 8 hours.   CT Head Wo Contrast  Result Date: 10/21/2022 CLINICAL DATA:  Altered mental status. EXAM: CT HEAD WITHOUT CONTRAST TECHNIQUE: Contiguous axial images were obtained  from the base of the skull through the vertex without intravenous contrast. RADIATION DOSE REDUCTION: This exam was performed according to the departmental dose-optimization program which includes automated exposure control, adjustment of the mA and/or kV according to patient size and/or use of iterative reconstruction technique. COMPARISON:  January 29, 2020 FINDINGS: Brain: There is mild cerebral atrophy with widening of the extra-axial spaces and ventricular dilatation. There are areas of decreased attenuation within the white matter tracts of the supratentorial brain, consistent with microvascular disease changes. Vascular: No hyperdense vessel or unexpected calcification. Skull: Negative for an acute osseous abnormality. Small stable, benign-appearing lytic areas are seen scattered throughout the skull. Sinuses/Orbits: There is mild to moderate severity right maxillary sinus mucosal thickening. Other: None. IMPRESSION: 1. No acute intracranial abnormality. 2. Generalized cerebral atrophy and microvascular disease changes of the supratentorial brain. 3. Mild to moderate severity right maxillary sinus disease. Electronically Signed   By: Aram Candela M.D.   On: 10/21/2022 01:42   DG Chest Port 1 View  Result Date: 10/21/2022 CLINICAL DATA:  Altered mental status. EXAM: PORTABLE CHEST 1 VIEW COMPARISON:  July 18, 2022 FINDINGS: The cardiac silhouette is mildly enlarged. This is increased in size when compared to  the prior study and may be, in part, technical in origin. Mild prominence of the pulmonary vasculature is seen. Mild, diffuse, chronic appearing increased lung markings are seen. Mild atelectasis is noted within the left lung base. A small left pleural effusion is seen. No pneumothorax is identified. The visualized skeletal structures are unremarkable. IMPRESSION: 1. Mild pulmonary vascular congestion with mild left basilar atelectasis. 2. Small left pleural effusion. Electronically Signed    By: Aram Candela M.D.   On: 10/21/2022 00:59    Pending Labs Unresulted Labs (From admission, onward)     Start     Ordered   10/21/22 0500  Basic metabolic panel  Once,   R        10/21/22 0500   10/21/22 0500  CBC  Once,   R        10/21/22 0500   10/21/22 0205  Magnesium  Once,   R        10/21/22 0204            Vitals/Pain Today's Vitals   10/21/22 0145 10/21/22 0159 10/21/22 0200 10/21/22 0215  BP: (!) 163/76  (!) 156/72 (!) 163/76  Pulse: 83 81 76 84  Resp: 19 18 17 15   Temp:  (!) 95.1 F (35.1 C) (!) 95.1 F (35.1 C) (!) 95.4 F (35.2 C)  TempSrc:  Core (Comment)    SpO2: 100% 100% 100% 100%  PainSc:        Isolation Precautions No active isolations  Medications Medications  dextrose 10 % infusion ( Intravenous New Bag/Given 10/21/22 0205)  enoxaparin (LOVENOX) injection 40 mg (has no administration in time range)  ondansetron (ZOFRAN) tablet 4 mg (has no administration in time range)    Or  ondansetron (ZOFRAN) injection 4 mg (has no administration in time range)  dextrose 50 % solution 50 mL ( Intravenous Not Given 10/21/22 0039)  potassium chloride SA (KLOR-CON M) CR tablet 40 mEq (40 mEq Oral Given 10/21/22 0203)    Mobility walks     Focused Assessments Neuro Assessment Handoff:  Swallow screen pass? Yes  Cardiac Rhythm: Normal sinus rhythm       Neuro Assessment: Exceptions to WDL Neuro Checks:      Has TPA been given? No If patient is a Neuro Trauma and patient is going to OR before floor call report to 4N Charge nurse: [phone] or [phone]   R Recommendations: See Admitting Provider Note  Report given to:   Additional Notes: Pt alter, is responsive, very soft spoken, nods and gestures. Able to follow commands, pt is able to lift her self off bed and turn side to side. Pt took PO pills w/o difficulty. Core temp low, pt has rectal probe for temperature monitoring. Family were here, unsure if they have left the premise.  Unsure of pt's actual baseline. Pt has hx of diabetes, family were poor historians. NSR on the monitor. Last CBG 103, pt is currently on bear hugger to increase core body temp.

## 2022-10-21 NOTE — Assessment & Plan Note (Signed)
Replace K Repeat BMP in AM Check Mg

## 2022-10-21 NOTE — Assessment & Plan Note (Signed)
Due to hypoglycemia. Lawyer.

## 2022-10-21 NOTE — ED Provider Notes (Signed)
[facility] EM[facility]GENC[facility] DE[facility] AT [facility] Provider Note   CSN: [id number] Arrival date & time: 10/21/22  0031     History  Chief Complaint  Patient presents with   Unresponsive    Jillian Travis is a 72 y.o. female.  History provided by: No one available. The history is limited by the condition of the patient (Patient unresponsive).  She has history of diabetes, hyperlipidemia and was dropped off at the emergency department entrance completely unresponsive and the car that dropped her off left without telling anybody anything.  She appeared cyanotic and was not breathing.  She was immediately brought back to resuscitation room.   Home Medications Prior to Admission medications   Medication Sig Start Date End Date Taking? Authorizing Provider  HUMALOG KWIKPEN 100 UNIT/ML KwikPen Inject 3 Units into the skin 3 (three) times daily. 09/21/22  Yes [provider]  LANTUS SOLOSTAR 100 UNIT/ML Solostar Pen Inject 18 Units into the skin daily. 10/16/22  Yes [provider]  metFORMIN (GLUCOPHAGE) 1000 MG tablet Take 500 mg by mouth every 12 (twelve) hours. 09/16/22  Yes [provider]  simvastatin (ZOCOR) 10 MG tablet 10 mg every evening. 10/04/22  Yes [provider]      Allergies    Patient has no known allergies.    Review of Systems   Review of Systems  Unable to perform ROS: Patient unresponsive    Physical Exam Updated Vital Signs BP (!) 161/77   Pulse 86   Temp (!) 94.7 F (34.8 C) (Rectal)   Resp 19   SpO2 100%  Physical Exam Vitals and nursing note reviewed.   72 year old female, unresponsive but in no acute distress. Vital signs are significant for elevated blood pressure and low temperature. Oxygen saturation is 100%, which is normal. Head is normocephalic and atraumatic. PERRLA. Neck is supple. Lungs are clear without rales, wheezes, or rhonchi. Chest moves symmetrically. Heart has regular rate and rhythm without  murmur. Abdomen is soft, flat. Extremities have trace. Skin is warm and dry without rash. Neurologic: Patient is unresponsive to verbal stimuli, moves both arms and both legs equally to noxious stimuli.  She is breathing on her own.  ED Results / Procedures / Treatments   Labs (all labs ordered are listed, but only abnormal results are displayed) Labs Reviewed  CBC WITH DIFFERENTIAL/PLATELET - Abnormal; Notable for the following components:      Result Value   WBC 12.0 (*)    All other components within normal limits  CBG MONITORING, ED - Abnormal; Notable for the following components:   Glucose-Capillary 12 (*)    All other components within normal limits  CBG MONITORING, ED - Abnormal; Notable for the following components:   Glucose-Capillary 212 (*)    All other components within normal limits  CBG MONITORING, ED - Abnormal; Notable for the following components:   Glucose-Capillary 122 (*)    All other components within normal limits  COMPREHENSIVE METABOLIC PANEL  ETHANOL  URINALYSIS, W/ REFLEX TO CULTURE (INFECTION SUSPECTED)  RAPID URINE DRUG SCREEN, HOSP PERFORMED    EKG EKG Interpretation  Date/Time:  Saturday Oct 21 2022 01:36:29 EDT Ventricular Rate:  75 PR Interval:  157 QRS Duration: 89 QT Interval:  427 QTC Calculation: 477 R Axis:   24 Text Interpretation: Sinus rhythm Normal ECG No old tracing to compare Confirmed by Dione Booze ([id number]) on 10/21/2022 7:47:42 AM  Radiology DG Chest Port 1 View  Result Date: 10/21/2022  CLINICAL DATA:  Altered mental status. EXAM: PORTABLE CHEST 1 VIEW COMPARISON:  July 18, 2022 FINDINGS: The cardiac silhouette is mildly enlarged. This is increased in size when compared to the prior study and may be, in part, technical in origin. Mild prominence of the pulmonary vasculature is seen. Mild, diffuse, chronic appearing increased lung markings are seen. Mild atelectasis is noted within the left lung base. A small left pleural  effusion is seen. No pneumothorax is identified. The visualized skeletal structures are unremarkable. IMPRESSION: 1. Mild pulmonary vascular congestion with mild left basilar atelectasis. 2. Small left pleural effusion. Electronically Signed   By: Aram Candela M.D.   On: 10/21/2022 00:59    Procedures Procedures  Cardiac monitor shows normal sinus rhythm, per my interpretation.  Medications Ordered in ED Medications  dextrose 50 % solution 50 mL ( Intravenous Not Given 10/21/22 0039)    ED Course/ Medical Decision Making/ A&P                             Medical Decision Making Amount and/or Complexity of Data Reviewed Labs: ordered. Radiology: ordered.  Risk Prescription drug management. Decision regarding hospitalization.   Patient arrived unresponsive with minimal respiratory effort and was brought back with Ambu bag assisted respirations.  CBG was markedly low at 12.  IV was started and she was given intravenous glucose with immediate improvement in mental status.  She started thrashing about in the bed and now would open her eyes to voice but was not following commands and did not answer questions.  Repeat CBG was 212.  I ordered workup including CBC, comprehensive metabolic panel, ethanol level, urine drug screen, urinalysis, chest x-ray, CT of head.  Family member has arrived and states that she became unresponsive this evening but has had decreased conversation over the last 2 days.  She does not check her glucose at home and is not on any set diet.  At this point, she is calm and she does respond to voice but is definitely not back at her baseline.  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  I have reviewed her old records, and she had been admitted to the hospital 07/18/2022 for diabetic hyperglycemic nonketotic coma.  She is noted to be hypothermic.  This is likely sequelae of hypoglycemia, and I have ordered  external warming.  Repeat CBG went down below 100 and I was concerned that she would become hypoglycemic again and I have ordered a glucose infusion.  I have reviewed and interpreted her laboratory tests and my interpretation is mild hypokalemia, marked hyperglycemia, mild leukocytosis which is nonspecific, normal magnesium level.  Urinalysis is strongly suggestive of UTI, urine drug screen negative, ethanol nondetectable.  CT of head shows no acute process.  Chest x-ray shows mild pulmonary vascular congestion.  Have independently viewed all of these images, and agree with the radiologist's interpretation.  I have reviewed and interpreted her electrocardiogram and my interpretation is normal ECG.  I have discussed the case with Dr. Julian Reil, who agrees to admit the patient.  CRITICAL CARE Performed by: Dione Booze Total critical care time: 95 minutes Critical care time was exclusive of separately billable procedures and treating other patients. Critical care was necessary to treat or prevent imminent or life-threatening deterioration. Critical care was time spent personally by me on the following activities: development of treatment plan with patient and/or surrogate  as well as nursing, discussions with consultants, evaluation of patient's response to treatment, examination of patient, obtaining history from patient or surrogate, ordering and performing treatments and interventions, ordering and review of laboratory studies, ordering and review of radiographic studies, pulse oximetry and re-evaluation of patient's condition.  Final Clinical Impression(s) / ED Diagnoses Final diagnoses:  Hypoglycemia  Hypothermia, initial encounter  Urinary tract infection without hematuria, site unspecified    Rx / DC Orders ED Discharge Orders     None         Dione Booze, MD 10/21/22 [phone]

## 2022-10-21 NOTE — ED Notes (Signed)
Receiving RN Courtney has agreed to accept TOC once pt has arrived to inpatient unit, all questions and concerns address. 

## 2022-10-21 NOTE — ED Triage Notes (Signed)
Pt arrived POV from car unresponsive with agonal RR, staff removed pt from car and began assisting with ventilations and brought to trauma B. Family left in car, no history to provide or information.

## 2022-10-21 NOTE — ED Notes (Signed)
Bear hugger applied for warmth

## 2022-10-21 NOTE — ED Notes (Signed)
Pt is now on 6L [place] from [facility]

## 2022-10-21 NOTE — Plan of Care (Signed)

## 2022-10-22 DIAGNOSIS — E11649 Type 2 diabetes mellitus with hypoglycemia without coma: Secondary | ICD-10-CM | POA: Diagnosis not present

## 2022-10-22 DIAGNOSIS — Z794 Long term (current) use of insulin: Secondary | ICD-10-CM | POA: Diagnosis not present

## 2022-10-22 LAB — GLUCOSE, CAPILLARY
Glucose-Capillary: 143 mg/dL — ABNORMAL HIGH (ref 70–99)
Glucose-Capillary: 130 mg/dL — ABNORMAL HIGH (ref 70–99)
Glucose-Capillary: 164 mg/dL — ABNORMAL HIGH (ref 70–99)
Glucose-Capillary: 175 mg/dL — ABNORMAL HIGH (ref 70–99)

## 2022-10-22 MED ORDER — SODIUM CHLORIDE 0.9 % IV SOLN
1.0000 g | INTRAVENOUS | Status: AC
  Administered 2022-10-22 – 2022-10-24 (×3): 1 g via INTRAVENOUS
  Filled 2022-10-22 (×3): qty 10

## 2022-10-22 NOTE — Progress Notes (Signed)
PROGRESS NOTE    Jillian Travis  EAV:[id number]  DOB: 02/21/1951  DOA: 10/21/2022 PCP: No primary care provider on file. Outpatient Specialists:   Hospital course:  72 year old female with DM2 who speaks only [place] who was admitted for altered mental status workup in ED showed profound hypoglycemia and hypothermia and patient was started on D10 at 100/hr.  It was noted that patient had been admitted for HHS 3 months ago and per report, patient is on insulin but does not check her blood glucoses at home.  UA was positive for UTI and patient was started on ceftriaxone.   Subjective:  Patient states that she feels well.  Notes that she is eating well.  Does not think her son will be coming in to see her today.  States that she met with diabetes coordinator yesterday.  Is hoping to get better understanding of her diabetes.   Objective: Vitals:   10/21/22 2001 10/21/22 2327 10/22/22 0815 10/22/22 1059  BP: 135/68 (!) 150/66 (!) 161/72 (!) 158/72  Pulse: 76 65 80 66  Resp: 20 16 20 19   Temp: 98.2 F (36.8 C) 97.8 F (36.6 C) 98.3 F (36.8 C) 97.9 F (36.6 C)  TempSrc: Oral Oral Oral Oral  SpO2: 96% 97% 95% 97%  Weight:      Height:        Intake/Output Summary (Last 24 hours) at 10/22/2022 1548 Last data filed at 10/22/2022 0815 Gross per 24 hour  Intake 988.48 ml  Output --  Net 988.48 ml   Filed Weights   10/21/22 0425  Weight: 58.1 kg     Exam:  General: Comfortable appearing patient sitting up in bed in NAD Eyes: sclera anicteric, conjuctiva mild injection bilaterally CVS: S1-S2, regular  Respiratory:  decreased air entry bilaterally secondary to decreased inspiratory effort, rales at bases  GI: NABS, soft, NT  LE: Warm and well-perfused Neuro: A/O x 3,  grossly nonfocal.  Psych: patient is logical and coherent, judgement and insight appear normal, mood and affect appropriate to situation.  Data Reviewed:  Basic Metabolic Panel: Recent Labs  Lab  10/21/22 0040 10/21/22 0556  NA 140 140  K 3.2* 4.1  CL 105 105  CO2 22 27  GLUCOSE <20* 148*  BUN 12 11  CREATININE 1.05* 1.04*  CALCIUM 9.5 9.0  MG 1.8  --     CBC: Recent Labs  Lab 10/21/22 0040 10/21/22 0556  WBC 12.0* 7.9  NEUTROABS 7.2  --   HGB 14.5 12.0  HCT 44.8 37.0  MCV 89.4 86.4  PLT 231 180     Scheduled Meds:  enoxaparin (LOVENOX) injection  40 mg Subcutaneous Q24H   Continuous Infusions:  cefTRIAXone (ROCEPHIN)  IV 1 g (10/22/22 1209)   dextrose 5 % and 0.45 % NaCl 25 mL/hr at 10/22/22 0934     Assessment & Plan:    Hypoglycemia DM2 on insulin, does not check sugars Will decrease D5 to 25 cc an hour Fingersticks every 6 hours Patient eating and drinking well DC D5 if blood sugars remain WNL May need to consider SSI if blood sugars are too high after discontinuing D5 Diabetes coordinator following  UTI Continue ceftriaxone day #2   Acute metabolic encephalopathy Resolved with treatment of hyperglycemia She is back to baseline   Hypothermia Resolved   Hypokalemia Normalized with repletion    DVT prophylaxis: Lovenox Code Status: Full code Family Communication: None today, states her son will be back tomorrow Disposition Plan:  Patient is from: Home  Anticipated Discharge Location: Home  Barriers to Discharge: Monitor blood sugar and assess need for insulin, need for diabetes education  Studies: CT Head Wo Contrast  Result Date: 10/21/2022 CLINICAL DATA:  Altered mental status. EXAM: CT HEAD WITHOUT CONTRAST TECHNIQUE: Contiguous axial images were obtained from the base of the skull through the vertex without intravenous contrast. RADIATION DOSE REDUCTION: This exam was performed according to the departmental dose-optimization program which includes automated exposure control, adjustment of the mA and/or kV according to patient size and/or use of iterative reconstruction technique. COMPARISON:  January 29, 2020 FINDINGS: Brain:  There is mild cerebral atrophy with widening of the extra-axial spaces and ventricular dilatation. There are areas of decreased attenuation within the white matter tracts of the supratentorial brain, consistent with microvascular disease changes. Vascular: No hyperdense vessel or unexpected calcification. Skull: Negative for an acute osseous abnormality. Small stable, benign-appearing lytic areas are seen scattered throughout the skull. Sinuses/Orbits: There is mild to moderate severity right maxillary sinus mucosal thickening. Other: None. IMPRESSION: 1. No acute intracranial abnormality. 2. Generalized cerebral atrophy and microvascular disease changes of the supratentorial brain. 3. Mild to moderate severity right maxillary sinus disease. Electronically Signed   By: Aram Candela M.D.   On: 10/21/2022 01:42   DG Chest Port 1 View  Result Date: 10/21/2022 CLINICAL DATA:  Altered mental status. EXAM: PORTABLE CHEST 1 VIEW COMPARISON:  July 18, 2022 FINDINGS: The cardiac silhouette is mildly enlarged. This is increased in size when compared to the prior study and may be, in part, technical in origin. Mild prominence of the pulmonary vasculature is seen. Mild, diffuse, chronic appearing increased lung markings are seen. Mild atelectasis is noted within the left lung base. A small left pleural effusion is seen. No pneumothorax is identified. The visualized skeletal structures are unremarkable. IMPRESSION: 1. Mild pulmonary vascular congestion with mild left basilar atelectasis. 2. Small left pleural effusion. Electronically Signed   By: Aram Candela M.D.   On: 10/21/2022 00:59    Principal Problem:   Uncontrolled type 2 diabetes mellitus with hypoglycemia, with long-term current use of insulin (HCC) Active Problems:   Hypoglycemia due to insulin   Acute metabolic encephalopathy   Hypothermia   Hypokalemia     Shelli Portilla Orma Flaming, [facility]  If 7PM-7AM, please contact  night-coverage [email or web address]   LOS: 0 days

## 2022-10-23 DIAGNOSIS — E11649 Type 2 diabetes mellitus with hypoglycemia without coma: Secondary | ICD-10-CM | POA: Diagnosis present

## 2022-10-23 DIAGNOSIS — Z603 Acculturation difficulty: Secondary | ICD-10-CM | POA: Diagnosis present

## 2022-10-23 DIAGNOSIS — T383X1A Poisoning by insulin and oral hypoglycemic [antidiabetic] drugs, accidental (unintentional), initial encounter: Secondary | ICD-10-CM | POA: Diagnosis present

## 2022-10-23 DIAGNOSIS — E1165 Type 2 diabetes mellitus with hyperglycemia: Secondary | ICD-10-CM | POA: Diagnosis not present

## 2022-10-23 DIAGNOSIS — Z7984 Long term (current) use of oral hypoglycemic drugs: Secondary | ICD-10-CM | POA: Diagnosis not present

## 2022-10-23 DIAGNOSIS — Z794 Long term (current) use of insulin: Secondary | ICD-10-CM

## 2022-10-23 DIAGNOSIS — R680 Hypothermia, not associated with low environmental temperature: Secondary | ICD-10-CM | POA: Diagnosis present

## 2022-10-23 DIAGNOSIS — N390 Urinary tract infection, site not specified: Secondary | ICD-10-CM | POA: Diagnosis present

## 2022-10-23 DIAGNOSIS — E162 Hypoglycemia, unspecified: Principal | ICD-10-CM | POA: Diagnosis present

## 2022-10-23 DIAGNOSIS — R230 Cyanosis: Secondary | ICD-10-CM | POA: Diagnosis present

## 2022-10-23 DIAGNOSIS — Z91198 Patient's noncompliance with other medical treatment and regimen for other reason: Secondary | ICD-10-CM | POA: Diagnosis not present

## 2022-10-23 DIAGNOSIS — E876 Hypokalemia: Secondary | ICD-10-CM | POA: Diagnosis present

## 2022-10-23 DIAGNOSIS — E785 Hyperlipidemia, unspecified: Secondary | ICD-10-CM | POA: Diagnosis present

## 2022-10-23 DIAGNOSIS — Z79899 Other long term (current) drug therapy: Secondary | ICD-10-CM | POA: Diagnosis not present

## 2022-10-23 DIAGNOSIS — R4182 Altered mental status, unspecified: Secondary | ICD-10-CM | POA: Diagnosis present

## 2022-10-23 DIAGNOSIS — G9341 Metabolic encephalopathy: Secondary | ICD-10-CM | POA: Diagnosis present

## 2022-10-23 LAB — BASIC METABOLIC PANEL
Anion gap: 6 (ref 5–15)
BUN: 23 mg/dL (ref 8–23)
CO2: 25 mmol/L (ref 22–32)
Calcium: 8.4 mg/dL — ABNORMAL LOW (ref 8.9–10.3)
Chloride: 107 mmol/L (ref 98–111)
Creatinine, Ser: 1.33 mg/dL — ABNORMAL HIGH (ref 0.44–1.00)
GFR, Estimated: 42 mL/min — ABNORMAL LOW (ref >60)
Glucose, Bld: 153 mg/dL — ABNORMAL HIGH (ref 70–99)
Potassium: 4.3 mmol/L (ref 3.5–5.1)
Sodium: 138 mmol/L (ref 135–145)

## 2022-10-23 LAB — GLUCOSE, CAPILLARY
Glucose-Capillary: 132 mg/dL — ABNORMAL HIGH (ref 70–99)
Glucose-Capillary: 140 mg/dL — ABNORMAL HIGH (ref 70–99)
Glucose-Capillary: 164 mg/dL — ABNORMAL HIGH (ref 70–99)
Glucose-Capillary: 188 mg/dL — ABNORMAL HIGH (ref 70–99)

## 2022-10-23 NOTE — Progress Notes (Signed)
PROGRESS NOTE    Jillian Travis  GNF:[id number]  DOB: December 17, 1950  DOA: 10/21/2022 PCP: Patient, No Pcp Per Outpatient Specialists:   Hospital course:  72 year old female with DM2 who speaks only [place] who was admitted for altered mental status workup in ED showed profound hypoglycemia and hypothermia and patient was started on D10 at 100/hr.  It was noted that patient had been admitted for HHS 3 months ago and per report, patient is on insulin but does not check her blood glucoses at home.  UA was positive for UTI and patient was started on ceftriaxone.   Subjective:  Patient states she would like to go home.  However I noted that neither she nor her son seem to understand her diabetes.  She states she does not eat much sugar although she does eat rice.  She is not sure why she continued to give herself insulin, does not know if her doctor checks her A1c or not.   Objective: Vitals:   10/22/22 1606 10/22/22 2200 10/23/22 0100 10/23/22 1150  BP: (!) 161/83 (!) 161/64 (!) 150/61 (!) 161/73  Pulse: 82   72  Resp: 15 20 20 16   Temp:  97.8 F (36.6 C) 98.1 F (36.7 C) 97.7 F (36.5 C)  TempSrc: Oral Oral Oral Oral  SpO2: 96% 100% 100% 98%  Weight:      Height:        Intake/Output Summary (Last 24 hours) at 10/23/2022 1733 Last data filed at 10/22/2022 2018 Gross per 24 hour  Intake 719.77 ml  Output --  Net 719.77 ml    Filed Weights   10/21/22 0425  Weight: 58.1 kg     Exam:  General: Comfortable appearing patient sitting up in bed in NAD Eyes: sclera anicteric, conjuctiva mild injection bilaterally CVS: S1-S2, regular  Respiratory:  decreased air entry bilaterally secondary to decreased inspiratory effort, rales at bases  GI: NABS, soft, NT  LE: Warm and well-perfused Neuro: A/O x 3,  grossly nonfocal.  Psych: patient is logical and coherent, judgement and insight appear normal, mood and affect appropriate to situation.  Data Reviewed:  Basic Metabolic  Panel: Recent Labs  Lab 10/21/22 0040 10/21/22 0556 10/23/22 0146  NA 140 140 138  K 3.2* 4.1 4.3  CL 105 105 107  CO2 22 27 25   GLUCOSE <20* 148* 153*  BUN 12 11 23   CREATININE 1.05* 1.04* 1.33*  CALCIUM 9.5 9.0 8.4*  MG 1.8  --   --      CBC: Recent Labs  Lab 10/21/22 0040 10/21/22 0556  WBC 12.0* 7.9  NEUTROABS 7.2  --   HGB 14.5 12.0  HCT 44.8 37.0  MCV 89.4 86.4  PLT 231 180      Scheduled Meds:  enoxaparin (LOVENOX) injection  40 mg Subcutaneous Q24H   Continuous Infusions:  cefTRIAXone (ROCEPHIN)  IV 1 g (10/23/22 0856)     Assessment & Plan:    Hypoglycemia DM2 on insulin, does not check sugars Patient is doing fine off of D5/D10 Of note blood sugars are running 130s to 180s off of all antidiabetic medication Hemoglobin A1c is ordered and pending Unclear if she even needs to be antidiabetic upon discharge Diabetes coordinator contacted--patient, son and son's father to have meeting with diabetes coordinator for diabetes education and also to make sure they have a plan for follow-up.  Patient had been admitted with hypoglycemia giving herself insulin when she clearly did not need it. Patient to be discharged  home after diabetes coordinator meeting.  UTI Continue ceftriaxone day #3 for 3 days   Acute metabolic encephalopathy Resolved with treatment of hyperglycemia She is back to baseline   Hypothermia Resolved   Hypokalemia Normalized with repletion    DVT prophylaxis: Lovenox Code Status: Full code Family Communication: None today, states her son will be back tomorrow Disposition Plan:   Patient is from: Home  Anticipated Discharge Location: Home  Barriers to Discharge: Monitor blood sugar and assess need for insulin, need for diabetes education  Studies: No results found.  Principal Problem:   Uncontrolled type 2 diabetes mellitus with hypoglycemia, with long-term current use of insulin (HCC) Active Problems:   Hypoglycemia  due to insulin   Acute metabolic encephalopathy   Hypothermia   Hypokalemia   Hypoglycemia     Ridgely Anastacio Orma Flaming, [facility]  If 7PM-7AM, please contact night-coverage [email or web address]   LOS: 0 days

## 2022-10-23 NOTE — Inpatient Diabetes Management (Signed)
Inpatient Diabetes Program Recommendations  AACE/ADA: New Consensus Statement on Inpatient Glycemic Control (2015)  Target Ranges:  Prepandial:   less than 140 mg/dL      Peak postprandial:   less than 180 mg/dL (1-2 hours)      Critically ill patients:  140 - 180 mg/dL   Lab Results  Component Value Date   GLUCAP 188 (H) 10/23/2022    Review of Glycemic Control  Diabetes history: DM2 Outpatient Diabetes medications: Lantus 18 units QD, Humalog 3 units TID with meals, metformin 500 mg BID Current orders for Inpatient glycemic control: None  Inpatient Diabetes Program Recommendations:    Spoke with pt at bedside. Was unsure of what she was taking, as far as insulin and DM meds. HgbA1C pending.  Spoke with son on phone and he will be here on 5/28 at 0900 to speak with Diabetes Coordinator. States he will write down some questions tonight in preparation for meeting in the morning.   Will need Libre prescription at discharge. Doubtful pt will need insulin for home.   Continue to follow.  Thank you. Ailene Ards, RD, LDN, CDCES Inpatient Diabetes Coordinator [phone]

## 2022-10-24 DIAGNOSIS — G9341 Metabolic encephalopathy: Secondary | ICD-10-CM | POA: Diagnosis not present

## 2022-10-24 DIAGNOSIS — N390 Urinary tract infection, site not specified: Secondary | ICD-10-CM | POA: Diagnosis not present

## 2022-10-24 DIAGNOSIS — E876 Hypokalemia: Secondary | ICD-10-CM | POA: Diagnosis not present

## 2022-10-24 DIAGNOSIS — E11649 Type 2 diabetes mellitus with hypoglycemia without coma: Secondary | ICD-10-CM | POA: Diagnosis not present

## 2022-10-24 LAB — BASIC METABOLIC PANEL
Anion gap: 8 (ref 5–15)
BUN: 23 mg/dL (ref 8–23)
CO2: 24 mmol/L (ref 22–32)
Calcium: 9.1 mg/dL (ref 8.9–10.3)
Chloride: 103 mmol/L (ref 98–111)
Creatinine, Ser: 1.28 mg/dL — ABNORMAL HIGH (ref 0.44–1.00)
GFR, Estimated: 44 mL/min — ABNORMAL LOW (ref >60)
Glucose, Bld: 232 mg/dL — ABNORMAL HIGH (ref 70–99)
Potassium: 4.4 mmol/L (ref 3.5–5.1)
Sodium: 135 mmol/L (ref 135–145)

## 2022-10-24 LAB — GLUCOSE, CAPILLARY
Glucose-Capillary: 149 mg/dL — ABNORMAL HIGH (ref 70–99)
Glucose-Capillary: 216 mg/dL — ABNORMAL HIGH (ref 70–99)

## 2022-10-24 LAB — HEMOGLOBIN A1C
Hgb A1c MFr Bld: 6.7 % — ABNORMAL HIGH (ref 4.8–5.6)
Mean Plasma Glucose: 146 mg/dL

## 2022-10-24 MED ORDER — LANCET DEVICE MISC: 1.0000 | Freq: Three times a day (TID) | 0 refills | Status: AC

## 2022-10-24 MED ORDER — LIVING WELL WITH DIABETES BOOK
Freq: Once | Status: DC
  Filled 2022-10-24: qty 1

## 2022-10-24 MED ORDER — BLOOD GLUCOSE MONITORING SUPPL DEVI: 1.0000 | Freq: Three times a day (TID) | 0 refills | Status: AC

## 2022-10-24 MED ORDER — LIDOCAINE 5 % EX PTCH
1.0000 | MEDICATED_PATCH | CUTANEOUS | Status: DC
  Administered 2022-10-24: 1 via TRANSDERMAL
  Filled 2022-10-24: qty 1

## 2022-10-24 MED ORDER — BLOOD GLUCOSE TEST VI STRP: 1.0000 | ORAL_STRIP | Freq: Three times a day (TID) | 0 refills | Status: AC

## 2022-10-24 MED ORDER — LANCETS MISC. MISC: 1.0000 | Freq: Three times a day (TID) | 0 refills | Status: AC

## 2022-10-24 MED ORDER — LIDOCAINE 5 % EX PTCH: 1.0000 | MEDICATED_PATCH | CUTANEOUS | 0 refills | Status: AC

## 2022-10-24 NOTE — TOC Initial Note (Addendum)
Transition of Care [facility]) - Initial/Assessment Note    Patient Details  Name: Jillian Travis MRN: [id number] Date of Birth: [birth year]  [facility]) CM/SW Contact:    Lawerance Sabal, RN Phone Number: 10/24/2022, 2:31 PM  Clinical Narrative:                  Patient admitted with hypoglycemia. Taken off insulin for discharge.  Will DC to home in the care of family.  PCP is Kaleen Mask, MD and uses [facility] pharmacy on [facility].  Added to follow up on AVS.  Patient also follows with congregational nurse.  Expected Discharge Plan: Home/Self Care Barriers to Discharge: No Barriers Identified   Patient Goals and CMS Choice Patient states their goals for this hospitalization and ongoing recovery are:: to return home   Choice offered to / list presented to : NA      Expected Discharge Plan and Services   Discharge Planning Services: CM Consult   Living arrangements for the past 2 months: Single Family Home Expected Discharge Date: 10/24/22                                    Prior Living Arrangements/Services Living arrangements for the past 2 months: Single Family Home Lives with:: Spouse, Adult Children                   Activities of Daily Living Home Assistive Devices/Equipment: None ADL Screening (condition at time of admission) Patient's cognitive ability adequate to safely complete daily activities?: Yes Is the patient deaf or have difficulty hearing?: No Does the patient have difficulty seeing, even when wearing glasses/contacts?: No Does the patient have difficulty concentrating, remembering, or making decisions?: No Patient able to express need for assistance with ADLs?: Yes Does the patient have difficulty dressing or bathing?: No Independently performs ADLs?: Yes (appropriate for developmental age) Does the patient have difficulty walking or climbing stairs?: No Weakness of Legs: None Weakness of Arms/Hands: None  Permission  Sought/Granted                  Emotional Assessment              Admission diagnosis:  Hypoglycemia due to insulin [E16.0, T38.3X5A] Hypoglycemia [E16.2] Patient Active Problem List   Diagnosis Date Noted   Urinary tract infection without hematuria 10/24/2022   Hypoglycemia 10/23/2022   Uncontrolled type 2 diabetes mellitus with hypoglycemia, with long-term current use of insulin (HCC) 10/21/2022   Hypoglycemia due to insulin 10/21/2022   Hypokalemia 10/21/2022   Hypothermia 10/21/2022   Acute metabolic encephalopathy 10/21/2022   PCP:  Patient, No Pcp Per Pharmacy:   [facility] [phone] - [place], [place] - [address] AT [facility] [address] [place] [address] Phone: [phone] Fax: [phone]     Social Determinants of Health (SDOH) Social History: SDOH Screenings   Tobacco Use: Low Risk  (10/21/2022)   SDOH Interventions:     Readmission Risk Interventions     No data to display

## 2022-10-24 NOTE — Discharge Summary (Signed)
Physician Discharge Summary   Patient: Jillian Travis MRN: [id number] DOB: 26-Feb-1951  Admit date:     10/21/2022  Discharge date: 10/24/22  Discharge Physician: Arnetha Courser   PCP: Patient, No Pcp Per   Recommendations at discharge:  Please obtain CBC and BMP within a week We stopped home insulin due to significant hypoglycemia, patient to continue taking metformin twice daily only and need a close follow-up with PCP for further recommendations.  Discharge Diagnoses: Principal Problem:   Uncontrolled type 2 diabetes mellitus with hypoglycemia, with long-term current use of insulin (HCC) Active Problems:   Hypoglycemia due to insulin   Acute metabolic encephalopathy   Hypothermia   Hypokalemia   Hypoglycemia   Urinary tract infection without hematuria   Hospital Course: 72 year old female with DM2 who speaks only [place] who was admitted for altered mental status workup in ED showed profound hypoglycemia and hypothermia and patient was started on D10 at 100/hr. It was noted that patient had been admitted for HHS 3 months ago and per report, patient is on insulin but does not check her blood glucoses at home.  A1c of 6.7.  Patient was instructed to stop using insulin but continue to take metformin.  Patient will need a close follow-up with PCP for further recommendations.  Patient did had mild AKI which started improving before discharge.  Electrolyte abnormalities were corrected.  UA was concerning for UTI.  Patient received 3 doses of ceftriaxone while in the hospital.  No other urinary symptoms.  Patient did had mild left sided lower posterior chest pain, which appears to be musculoskeletal as it only occurs with ambulation or moving around.  Patient was given lidocaine patch.  She was instructed if insurance does not allow she can use over-the-counter lidocaine patches or any other rubbing cream to help.  Her mentation is now back to baseline and she will continue on current  medications and need to have a close follow-up with her providers for further recommendations.   Consultants: None Procedures performed: None  Disposition: Home Diet recommendation:  Discharge Diet Orders (From admission, onward)     Start     Ordered   10/24/22 0000  Diet - low sodium heart healthy        10/24/22 1332           Cardiac and Carb modified diet DISCHARGE MEDICATION: Allergies as of 10/24/2022   No Known Allergies      Medication List     STOP taking these medications    HumaLOG KwikPen 100 UNIT/ML KwikPen Generic drug: insulin lispro   Lantus SoloStar 100 UNIT/ML Solostar Pen Generic drug: insulin glargine       TAKE these medications    Blood Glucose Monitoring Suppl Devi 1 each by Does not apply route in the morning, at noon, and at bedtime. May substitute to any manufacturer covered by patient's insurance.   BLOOD GLUCOSE TEST STRIPS Strp 1 each by In Vitro route in the morning, at noon, and at bedtime. May substitute to any manufacturer covered by patient's insurance.   Glucagon Emergency 1 MG Kit Inject 1 mg into the skin once as needed (severe low blood sugar).   Lancet Device Misc 1 each by Does not apply route in the morning, at noon, and at bedtime. May substitute to any manufacturer covered by patient's insurance.   Lancets Misc. Misc 1 each by Does not apply route in the morning, at noon, and at bedtime. May substitute to any manufacturer covered  by patient's insurance.   lidocaine 5 % Commonly known as: LIDODERM Place 1 patch onto the skin daily. Remove & Discard patch within 12 hours or as directed by MD   metFORMIN 1000 MG tablet Commonly known as: GLUCOPHAGE Take 500 mg by mouth every 12 (twelve) hours.   simvastatin 10 MG tablet Commonly known as: ZOCOR Take 10 mg by mouth every evening.        Discharge Exam: Filed Weights   10/21/22 0425  Weight: 58.1 kg   General.  Frail elderly lady, in no acute  distress. Pulmonary.  Lungs clear bilaterally, normal respiratory effort. CV.  Regular rate and rhythm, no JVD, rub or murmur. Abdomen.  Soft, nontender, nondistended, BS positive. CNS.  Alert and oriented .  No focal neurologic deficit. Extremities.  No edema, no cyanosis, pulses intact and symmetrical. Psychiatry.  Judgment and insight appears normal.   Condition at discharge: stable  The results of significant diagnostics from this hospitalization (including imaging, microbiology, ancillary and laboratory) are listed below for reference.   Imaging Studies: CT Head Wo Contrast  Result Date: 10/21/2022 CLINICAL DATA:  Altered mental status. EXAM: CT HEAD WITHOUT CONTRAST TECHNIQUE: Contiguous axial images were obtained from the base of the skull through the vertex without intravenous contrast. RADIATION DOSE REDUCTION: This exam was performed according to the departmental dose-optimization program which includes automated exposure control, adjustment of the mA and/or kV according to patient size and/or use of iterative reconstruction technique. COMPARISON:  January 29, 2020 FINDINGS: Brain: There is mild cerebral atrophy with widening of the extra-axial spaces and ventricular dilatation. There are areas of decreased attenuation within the white matter tracts of the supratentorial brain, consistent with microvascular disease changes. Vascular: No hyperdense vessel or unexpected calcification. Skull: Negative for an acute osseous abnormality. Small stable, benign-appearing lytic areas are seen scattered throughout the skull. Sinuses/Orbits: There is mild to moderate severity right maxillary sinus mucosal thickening. Other: None. IMPRESSION: 1. No acute intracranial abnormality. 2. Generalized cerebral atrophy and microvascular disease changes of the supratentorial brain. 3. Mild to moderate severity right maxillary sinus disease. Electronically Signed   By: Aram Candela M.D.   On: 10/21/2022  01:42   DG Chest Port 1 View  Result Date: 10/21/2022 CLINICAL DATA:  Altered mental status. EXAM: PORTABLE CHEST 1 VIEW COMPARISON:  July 18, 2022 FINDINGS: The cardiac silhouette is mildly enlarged. This is increased in size when compared to the prior study and may be, in part, technical in origin. Mild prominence of the pulmonary vasculature is seen. Mild, diffuse, chronic appearing increased lung markings are seen. Mild atelectasis is noted within the left lung base. A small left pleural effusion is seen. No pneumothorax is identified. The visualized skeletal structures are unremarkable. IMPRESSION: 1. Mild pulmonary vascular congestion with mild left basilar atelectasis. 2. Small left pleural effusion. Electronically Signed   By: Aram Candela M.D.   On: 10/21/2022 00:59    Microbiology: No results found for this or any previous visit.  Labs: CBC: Recent Labs  Lab 10/21/22 0040 10/21/22 0556  WBC 12.0* 7.9  NEUTROABS 7.2  --   HGB 14.5 12.0  HCT 44.8 37.0  MCV 89.4 86.4  PLT 231 180   Basic Metabolic Panel: Recent Labs  Lab 10/21/22 0040 10/21/22 0556 10/23/22 0146 10/24/22 1140  NA 140 140 138 135  K 3.2* 4.1 4.3 4.4  CL 105 105 107 103  CO2 22 27 25 24   GLUCOSE <20* 148* 153* 232*  BUN  12 11 23 23   CREATININE 1.05* 1.04* 1.33* 1.28*  CALCIUM 9.5 9.0 8.4* 9.1  MG 1.8  --   --   --    Liver Function Tests: Recent Labs  Lab 10/21/22 0040  AST 35  ALT 22  ALKPHOS 66  BILITOT 0.5  PROT 7.8  ALBUMIN 3.8   CBG: Recent Labs  Lab 10/23/22 0625 10/23/22 1144 10/23/22 2350 10/24/22 0606 10/24/22 1209  GLUCAP 140* 188* 164* 149* 216*    Discharge time spent: greater than 30 minutes.  This record has been created using Conservation officer, historic buildings. Errors have been sought and corrected,but may not always be located. Such creation errors do not reflect on the standard of care.   Signed: Arnetha Courser, MD [facility] 10/24/2022

## 2022-10-24 NOTE — Inpatient Diabetes Management (Signed)
Inpatient Diabetes Program Recommendations  AACE/ADA: New Consensus Statement on Inpatient Glycemic Control (2015)  Target Ranges:  Prepandial:   less than 140 mg/dL      Peak postprandial:   less than 180 mg/dL (1-2 hours)      Critically ill patients:  140 - 180 mg/dL   Lab Results  Component Value Date   GLUCAP 149 (H) 10/24/2022   HGBA1C 6.7 (H) 10/23/2022    Review of Glycemic Control  Diabetes history: DM2 Outpatient Diabetes medications: Lantus 18 units QD, Humalog 3 units TID with meals , metformin 500 mg BID Current orders for Inpatient glycemic control:   HgbA1C - 6.7%  Inpatient Diabetes Program Recommendations:    For discharge:  Humalog 0-9 units TID Metformin 500 mg BID Freestyle Libre 3 Sensor- Order # [id number]   Reviewed diabetes survival skills, monitoring blood sugars, placed Libre on pt and instructed family on use, encouraged pt to make appt with PCP within the next week for diabetes management, discussed mpact of nutrition, exercise, stress, sickness, and medications on diabetes control. impact of nutrition, exercise, stress, sickness, and medications on diabetes control. Long discussion on hypoglycemia s/s and treatment. Answered all questrions and addressed concerns. Pt and family appreciative of visit.  Thank you. Ailene Ards, RD, LDN, CDCES Inpatient Diabetes Coordinator [phone]

## 2022-10-25 ENCOUNTER — Encounter (HOSPITAL_COMMUNITY): Payer: Self-pay
